# Patient Record
Sex: Male | Born: 1950 | Race: Black or African American | Hispanic: No | Marital: Married | Smoking: Never smoker
Health system: Southern US, Community
[De-identification: ages and names within clinical notes are randomized; demographics above are authoritative.]

## PROBLEM LIST (undated history)

## (undated) DIAGNOSIS — E119 Type 2 diabetes mellitus without complications: Secondary | ICD-10-CM

## (undated) DIAGNOSIS — E785 Hyperlipidemia, unspecified: Secondary | ICD-10-CM

## (undated) DIAGNOSIS — F419 Anxiety disorder, unspecified: Secondary | ICD-10-CM

## (undated) DIAGNOSIS — M1990 Unspecified osteoarthritis, unspecified site: Secondary | ICD-10-CM

## (undated) DIAGNOSIS — I10 Essential (primary) hypertension: Secondary | ICD-10-CM

## (undated) DIAGNOSIS — Z125 Encounter for screening for malignant neoplasm of prostate: Secondary | ICD-10-CM

## (undated) DIAGNOSIS — R42 Dizziness and giddiness: Secondary | ICD-10-CM

## (undated) DIAGNOSIS — Z79899 Other long term (current) drug therapy: Secondary | ICD-10-CM

## (undated) HISTORY — DX: Dizziness and giddiness: R42

## (undated) HISTORY — DX: Encounter for screening for malignant neoplasm of prostate: Z12.5

## (undated) HISTORY — DX: Other long term (current) drug therapy: Z79.899

## (undated) HISTORY — DX: Type 2 diabetes mellitus without complications: E11.9

## (undated) HISTORY — PX: KNEE SURGERY: SHX244

## (undated) HISTORY — DX: Unspecified osteoarthritis, unspecified site: M19.90

## (undated) HISTORY — PX: CARPAL TUNNEL RELEASE: SHX101

## (undated) HISTORY — PX: UMBILICAL HERNIA REPAIR: SHX196

## (undated) HISTORY — DX: Anxiety disorder, unspecified: F41.9

## (undated) HISTORY — DX: Essential (primary) hypertension: I10

## (undated) HISTORY — PX: ANKLE SURGERY: SHX546

## (undated) HISTORY — DX: Hyperlipidemia, unspecified: E78.5

## (undated) SURGERY — Surgical Case
Anesthesia: *Unknown

---

## 2010-09-21 ENCOUNTER — Emergency Department (HOSPITAL_COMMUNITY)
Admission: EM | Admit: 2010-09-21 | Discharge: 2010-09-21 | Disposition: A | Discharge status: Home / Self Care | Payer: Medicare Other | Attending: Emergency Medicine | Admitting: Emergency Medicine

## 2010-09-21 DIAGNOSIS — I10 Essential (primary) hypertension: Secondary | ICD-10-CM | POA: Insufficient documentation

## 2010-09-26 ENCOUNTER — Emergency Department (HOSPITAL_COMMUNITY): Payer: Medicare Other

## 2010-09-26 ENCOUNTER — Inpatient Hospital Stay (HOSPITAL_COMMUNITY)
Admission: EM | Admit: 2010-09-26 | Discharge: 2010-09-28 | DRG: 304 | Disposition: A | Discharge status: Home / Self Care | Payer: Medicare Other | Attending: Internal Medicine | Admitting: Internal Medicine

## 2010-09-26 ENCOUNTER — Inpatient Hospital Stay (HOSPITAL_COMMUNITY): Payer: Medicare Other

## 2010-09-26 DIAGNOSIS — E860 Dehydration: Secondary | ICD-10-CM | POA: Diagnosis present

## 2010-09-26 DIAGNOSIS — D72829 Elevated white blood cell count, unspecified: Secondary | ICD-10-CM | POA: Diagnosis present

## 2010-09-26 DIAGNOSIS — E876 Hypokalemia: Secondary | ICD-10-CM | POA: Diagnosis present

## 2010-09-26 DIAGNOSIS — I10 Essential (primary) hypertension: Principal | ICD-10-CM | POA: Diagnosis present

## 2010-09-26 DIAGNOSIS — Z794 Long term (current) use of insulin: Secondary | ICD-10-CM

## 2010-09-26 DIAGNOSIS — E1310 Other specified diabetes mellitus with ketoacidosis without coma: Secondary | ICD-10-CM | POA: Diagnosis present

## 2010-09-26 DIAGNOSIS — E785 Hyperlipidemia, unspecified: Secondary | ICD-10-CM | POA: Diagnosis present

## 2010-09-26 DIAGNOSIS — Z7982 Long term (current) use of aspirin: Secondary | ICD-10-CM

## 2010-09-26 LAB — DIFFERENTIAL
Basophils Absolute: 0 10*3/uL (ref 0.0–0.1)
Basophils Relative: 0 % (ref 0–1)
Monocytes Absolute: 0.5 10*3/uL (ref 0.1–1.0)
Neutro Abs: 6.3 10*3/uL (ref 1.7–7.7)

## 2010-09-26 LAB — URINALYSIS, ROUTINE W REFLEX MICROSCOPIC
Leukocytes, UA: NEGATIVE
Nitrite: NEGATIVE
Specific Gravity, Urine: 1.03 (ref 1.005–1.030)
Urobilinogen, UA: 0.2 mg/dL (ref 0.0–1.0)

## 2010-09-26 LAB — GLUCOSE, CAPILLARY
Glucose-Capillary: >600 mg/dL (ref 70–99)
Glucose-Capillary: 406 mg/dL — ABNORMAL HIGH (ref 70–99)
Glucose-Capillary: 297 mg/dL — ABNORMAL HIGH (ref 70–99)

## 2010-09-26 LAB — POCT I-STAT 3, VENOUS BLOOD GAS (G3P V)
O2 Saturation: 35 %
TCO2: 29 mmol/L (ref 0–100)

## 2010-09-26 LAB — CBC
Hemoglobin: 16.8 g/dL (ref 13.0–17.0)
MCHC: 35.1 g/dL (ref 30.0–36.0)
RDW: 12.3 % (ref 11.5–15.5)

## 2010-09-26 LAB — BASIC METABOLIC PANEL
CO2: 16 mEq/L — ABNORMAL LOW (ref 19–32)
Calcium: 10 mg/dL (ref 8.4–10.5)
Calcium: 9.5 mg/dL (ref 8.4–10.5)
Creatinine, Ser: 1.38 mg/dL (ref 0.4–1.5)
GFR calc Af Amer: 48 mL/min — ABNORMAL LOW (ref >60)
GFR calc Af Amer: >60 mL/min (ref >60)
GFR calc non Af Amer: 40 mL/min — ABNORMAL LOW (ref >60)
GFR calc non Af Amer: 53 mL/min — ABNORMAL LOW (ref >60)
Glucose, Bld: 273 mg/dL — ABNORMAL HIGH (ref 70–99)
Sodium: 122 mEq/L — ABNORMAL LOW (ref 135–145)
Sodium: 131 mEq/L — ABNORMAL LOW (ref 135–145)

## 2010-09-26 LAB — URINE MICROSCOPIC-ADD ON

## 2010-09-27 LAB — CBC
HCT: 41.5 % (ref 39.0–52.0)
MCH: 31.5 pg (ref 26.0–34.0)
MCHC: 35.2 g/dL (ref 30.0–36.0)
MCV: 89.4 fL (ref 78.0–100.0)
Platelets: 230 10*3/uL (ref 150–400)
RDW: 12.2 % (ref 11.5–15.5)
WBC: 11.3 10*3/uL — ABNORMAL HIGH (ref 4.0–10.5)

## 2010-09-27 LAB — GLUCOSE, CAPILLARY
Glucose-Capillary: 197 mg/dL — ABNORMAL HIGH (ref 70–99)
Glucose-Capillary: 165 mg/dL — ABNORMAL HIGH (ref 70–99)
Glucose-Capillary: 152 mg/dL — ABNORMAL HIGH (ref 70–99)
Glucose-Capillary: 143 mg/dL — ABNORMAL HIGH (ref 70–99)
Glucose-Capillary: 148 mg/dL — ABNORMAL HIGH (ref 70–99)
Glucose-Capillary: 202 mg/dL — ABNORMAL HIGH (ref 70–99)
Glucose-Capillary: 469 mg/dL — ABNORMAL HIGH (ref 70–99)
Glucose-Capillary: 389 mg/dL — ABNORMAL HIGH (ref 70–99)

## 2010-09-27 LAB — HEMOGLOBIN A1C
Hgb A1c MFr Bld: 11.4 % — ABNORMAL HIGH (ref <5.7)
Mean Plasma Glucose: 280 mg/dL — ABNORMAL HIGH (ref <117)

## 2010-09-27 LAB — COMPREHENSIVE METABOLIC PANEL
ALT: 22 U/L (ref 0–53)
Calcium: 9.5 mg/dL (ref 8.4–10.5)
Creatinine, Ser: 1.26 mg/dL (ref 0.4–1.5)
GFR calc Af Amer: >60 mL/min (ref >60)
GFR calc non Af Amer: 59 mL/min — ABNORMAL LOW (ref >60)
Glucose, Bld: 260 mg/dL — ABNORMAL HIGH (ref 70–99)
Sodium: 133 mEq/L — ABNORMAL LOW (ref 135–145)
Total Protein: 8.8 g/dL — ABNORMAL HIGH (ref 6.0–8.3)

## 2010-09-27 LAB — LIPID PANEL
Cholesterol: 248 mg/dL — ABNORMAL HIGH (ref 0–200)
HDL: 49 mg/dL (ref >39)
LDL Cholesterol: 170 mg/dL — ABNORMAL HIGH (ref 0–99)
Triglycerides: 147 mg/dL (ref <150)

## 2010-09-27 LAB — BASIC METABOLIC PANEL
CO2: 23 mEq/L (ref 19–32)
Chloride: 100 mEq/L (ref 96–112)
GFR calc Af Amer: >60 mL/min (ref >60)
Glucose, Bld: 206 mg/dL — ABNORMAL HIGH (ref 70–99)
Sodium: 135 mEq/L (ref 135–145)

## 2010-09-27 LAB — MAGNESIUM: Magnesium: 2.6 mg/dL — ABNORMAL HIGH (ref 1.5–2.5)

## 2010-09-28 LAB — GLUCOSE, CAPILLARY: Glucose-Capillary: 305 mg/dL — ABNORMAL HIGH (ref 70–99)

## 2010-09-28 LAB — BASIC METABOLIC PANEL
CO2: 24 mEq/L (ref 19–32)
Calcium: 8.7 mg/dL (ref 8.4–10.5)
Creatinine, Ser: 1.08 mg/dL (ref 0.4–1.5)
GFR calc Af Amer: >60 mL/min (ref >60)

## 2010-09-28 LAB — CBC
Hemoglobin: 13 g/dL (ref 13.0–17.0)
MCH: 30.8 pg (ref 26.0–34.0)
MCHC: 33.7 g/dL (ref 30.0–36.0)
Platelets: 193 10*3/uL (ref 150–400)
RDW: 12.4 % (ref 11.5–15.5)

## 2010-10-17 ENCOUNTER — Ambulatory Visit: Payer: Medicare Other | Admitting: Dietician

## 2010-10-30 ENCOUNTER — Encounter: Payer: Medicare Other | Attending: Urology | Admitting: Dietician

## 2010-10-30 DIAGNOSIS — E119 Type 2 diabetes mellitus without complications: Secondary | ICD-10-CM | POA: Insufficient documentation

## 2010-10-30 DIAGNOSIS — Z713 Dietary counseling and surveillance: Secondary | ICD-10-CM | POA: Insufficient documentation

## 2010-11-20 NOTE — Discharge Summary (Signed)
NAME:  Clarence Brooks, Clarence Brooks NO.:  [id number]  MEDICAL RECORD NO.:  [id number]           PATIENT TYPE:  I  LOCATION:  5504                         FACILITY:  [facility]  PHYSICIAN:  Ladell Pier, M.D.   DATE OF BIRTH:  24-Dec-1950  DATE OF ADMISSION:  09/26/2010 DATE OF DISCHARGE:  09/28/2010                              DISCHARGE SUMMARY   DISCHARGE DIAGNOSES: 1. Headache. 2. Hypertension. 3. New-onset diabetes. 4. Dyslipidemia. 5. Dehydration. 6. Diabetic ketoacidosis. 7. New-onset type 2 diabetes.  DISCHARGE MEDICATIONS: 1. Amlodipine 5 mg daily. 2. Aspirin 81 mg daily. 3. Sliding scale insulin. 4. Lantus 15 units at bedtime. 5. Losartan 25 mg daily. 6. Crestor 10 mg at bedtime. 7. Tylenol 1 tablet daily as needed. The patient is also discharged with insulin syringes and a prescription for a diabetic starter kit.  PROCEDURES: 1. CT of the head, old right basal ganglia lacunar infarct.  No acute     intracranial abnormality. 2. Chest x-ray low lung volumes with vascular crowding and     atelectasis.  CONSULTANTS:  None.  HISTORY OF PRESENT ILLNESS:  The patient is a 60 year old African American male with past medical history significant for hypertension. The patient stated that for the past 4-5 days he has been having left- sided headache and some nausea and increased urinary frequency but no chest pain, no shortness of breath.  He presented to the doctor's office and was brought via EMS to the emergency room.  At the doctor's office, CBG was 450.  In the emergency room, CBGs and his blood sugars were over 900.  PAST MEDICAL HISTORY:  Per admission H and P.  FAMILY HISTORY:  Per admission H and P.  SOCIAL HISTORY:  Per admission H and P.  MEDICATIONS:  Per admission H and P.  ALLERGIES:  Per admission H and P.  REVIEW OF SYSTEMS:  Per admission H and P.  PHYSICAL EXAMINATION AT THE TIME OF DISCHARGE:  VITAL SIGNS: Temperature 97.6, pulse of  66, respirations 18, blood pressure 131/79, pulse ox 96% on room air. GENERAL:  The patient is sitting up in chair, well-nourished African American male. HEENT:  Head:  Normocephalic, atraumatic.  Pupils reactive to light. Throat without erythema. CARDIOVASCULAR:  Regular rate and rhythm. LUNGS:  Clear bilaterally. ABDOMEN:  Positive bowel sounds. EXTREMITIES:  Without any edema.  HOSPITAL COURSE: 1. Headache:  The patient initially had some headache, blood pressure     was elevated, most likely hypertensive urgency.  Once the blood     pressure improved, the headache slowly started to resolve.  He did     have a head CT that only showed an old lacunar infarct, no acute     stroke. 2. DKA:  The patient is noted to be in DKA with a gap of 22, this is     new onset.  We will start him on Lantus and sliding scale insulin.     We will go ahead and discharge him on Lantus sliding scale insulin,     and he will follow up as an outpatient with [facility]  Para March,     the nurse practitioner, who he has seen in the past. 3. Dyslipidemia.  The patient will be discharged on Crestor once daily     and that can be followed up as an outpatient.  DISCHARGE LABORATORY DATA:  Sodium 135, potassium 4.0, chloride 105, CO2 is 24, glucose 234, BUN 21, creatinine 1.08.  WBC 7.4, hemoglobin 13, MCV 91.5, platelets 193.  Hemoglobin A1c 11.4.  TSH 0.822.  Total cholesterol 248, triglycerides 147, HDL 49, LDL of 170, creatine kinase of 117.  Time spent with the patient and doing this discharge is approximately 40 minutes.     Ladell Pier, M.D.     NJ/MEDQ  D:  09/28/2010  T:  09/28/2010  Job:  [id number]  Electronically Signed by Virginia Rochester M.D. on 11/20/2010 03:54:32 PM

## 2010-11-20 NOTE — H&P (Signed)
NAME:  BEATRIZ, SETTLES NO.:  [id number]  MEDICAL RECORD NO.:  [id number]           PATIENT TYPE:  E  LOCATION:  [facility]                         FACILITY:  [facility]  PHYSICIAN:  Ladell Pier, M.D.   DATE OF BIRTH:  [birth year]  DATE OF ADMISSION:  09/26/2010 DATE OF DISCHARGE:                             HISTORY & PHYSICAL   CHIEF COMPLAINT:  Headache.  HISTORY OF PRESENT ILLNESS:  The patient is a 60 year old African- American male with past medical history significant for hypertension. The patient stated that for the past 4 to 5 days has been having left- sided headache and some nausea and increased urinary frequency, but no chest pain, no shortness of breath.  He presented to the doctor's office and was brought via EMS to the emergency room.  At the doctor's office, CBG was over 450.  The patient had no history of diabetes.  PAST MEDICAL HISTORY:  Significant for, 1. Hypertension. 2. Hernia repair x2. 3. Carpal tunnel surgery x2. 4. Bilateral knee repair surgery.  FAMILY HISTORY:  Mother has blood pressure.  Father died of throat cancer.  SOCIAL HISTORY:  He does not smoke.  He does not drink alcohol.  He is married.  He has three children, two grown and one 29 years old.  He is presently on disability secondary to his multiple orthopedic surgeries.  MEDICATIONS:  He takes triamterene HCTZ daily, but per the patient he has not been taken it.  ALLERGIES:  TO LISINOPRIL.  REVIEW OF SYSTEMS:  Negative, otherwise stated in HPI.  PHYSICAL EXAM:  VITAL SIGNS:  Temperature 37.5, blood pressure 142/100, pulse 105, respirations 18, pulse ox 97% on room air. GENERAL:  The patient is sitting up on stretcher, well-nourished, African-American male. HEENT:  Normocephalic, atraumatic.  Pupils reactive to light.  Throat without erythema. CARDIOVASCULAR:  Tachycardic. LUNGS:  Clear bilaterally. ABDOMEN:  Positive bowel sounds. EXTREMITIES:  Without edema. NEURO:   Nonfocal.  Cranial nerves II through XII intact.  Strength 5/5 throughout.  Finger-to-nose intact.  LABORATORY DATA:  Labs, pH 7.30, PCO2 of 54, PO2 of 23, bicarb 27.5 this is a venous sample.  3 to 6 wbc's, 0-2 rbc, greater than 100,000 glucose in the urine.  Sodium 122, potassium 5.2, chloride 84, CO2 of 16, glucose of 959, BUN 35, creatinine 1.76, calcium 10.  WBC 9.5, hemoglobin 16.8, MCV 89.9, platelets of 241.  ASSESSMENT: 1. Diabetic ketoacidosis. 2. Hypertension. 3. Dehydration. 4. Headache.  PLAN:  We will admit the patient to the hospital, put him on DKA protocol, start him on Norvasc.  He is allergic to ACE inhibitors. Also, we will Lopressor p.r.n. for hypertension.  Check fasting lipid, hemoglobin A1c.  With his headache, we will get a head CT.  He will get a chest x-ray as well, get EKG, check creatinine kinase levels.  He has no chest pain, so we will not cycle cardiac enzymes.  We will also check his liver function tests and he already has a urinalysis.  We will also get EKG.  Time spent with the patient and doing this admission is approximately 45 minutes.  Ladell Pier, M.D.     NJ/MEDQ  D:  09/26/2010  T:  09/26/2010  Job:  [id number]  Electronically Signed by Virginia Rochester M.D. on 11/20/2010 03:54:29 PM

## 2012-04-20 IMAGING — CT CT HEAD W/O CM
1 of 2 series · 13 of 30 positions shown, 17 images · non-contrast
Comparison: None

CLINICAL DATA: Hyperglycemia.  Left sided headache.

CT HEAD WITHOUT CONTRAST
TECHNIQUE: Contiguous axial images were obtained from the base of
the skull through the vertex without contrast.

[Series 2: brain · axial · 0.49mm/px · z∈[+147,+279]mm · 13 of 32 slices shown, 17 images]
[im 3/32  brain]
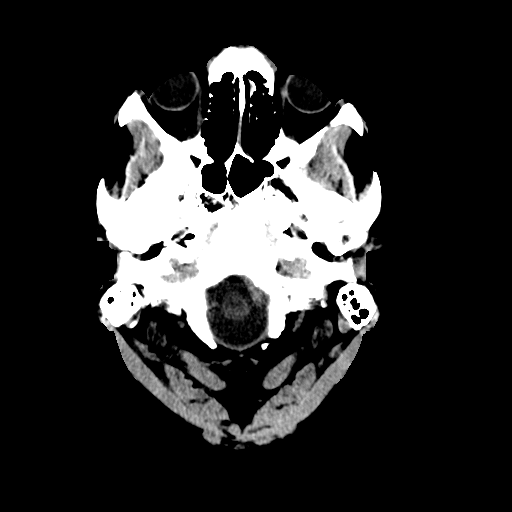
[im 3/32  bone]
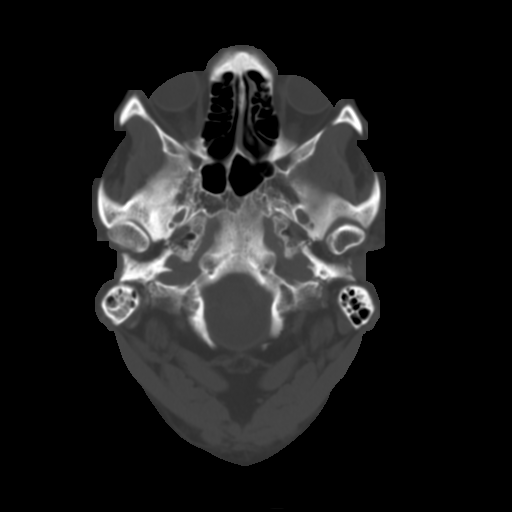
[im 5/32  brain]
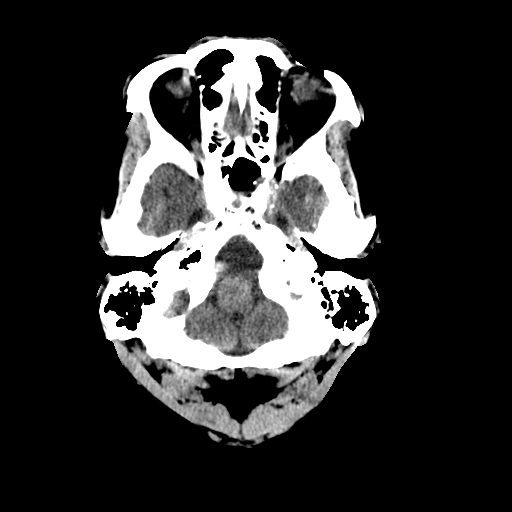
[im 7/32  brain]
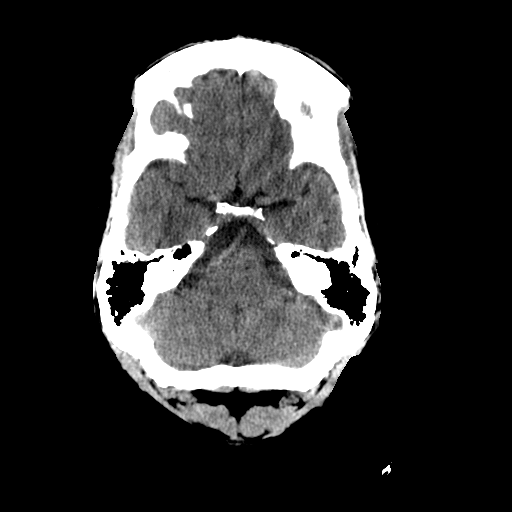
[im 9/32  brain]
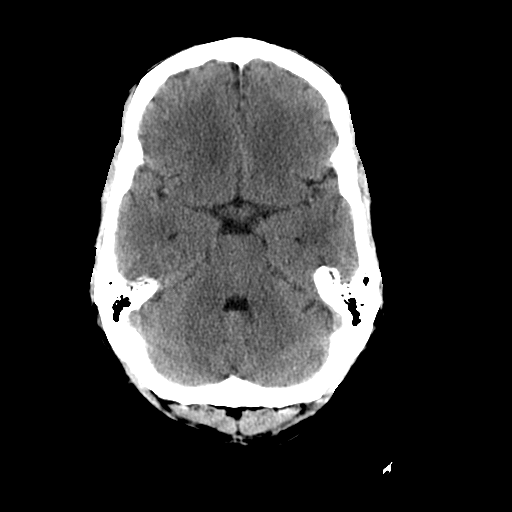
[im 12/32  brain]
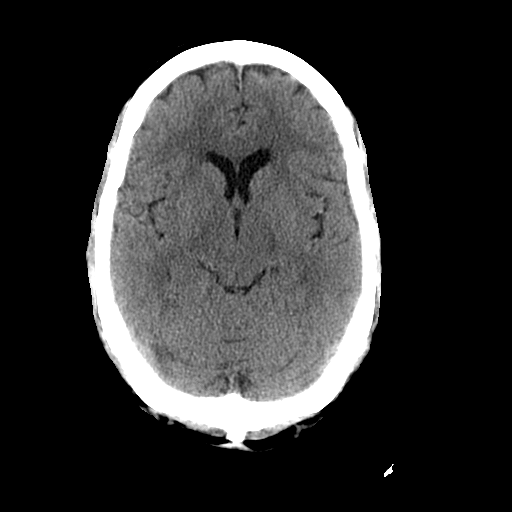
[im 12/32  bone]
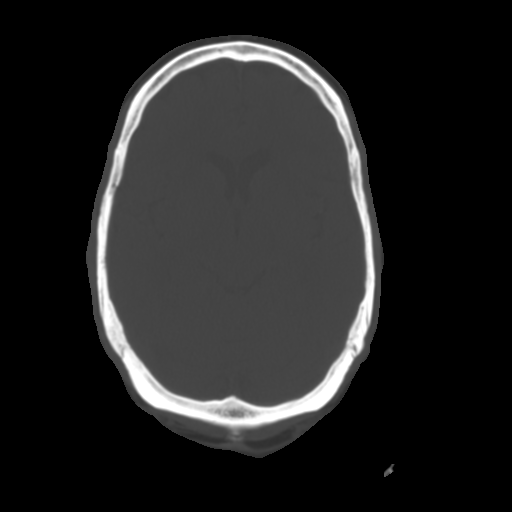
[im 14/32  brain]
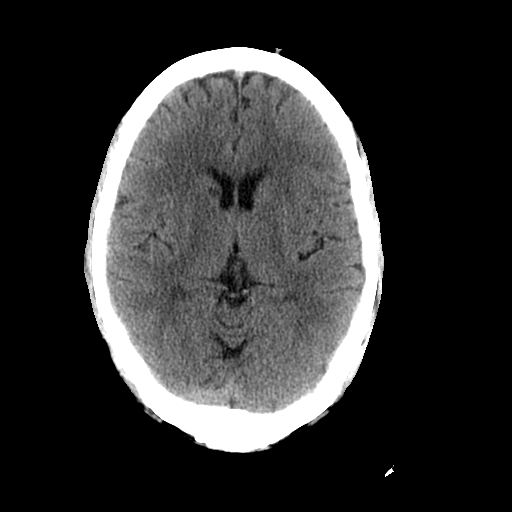
[im 16/32  brain]
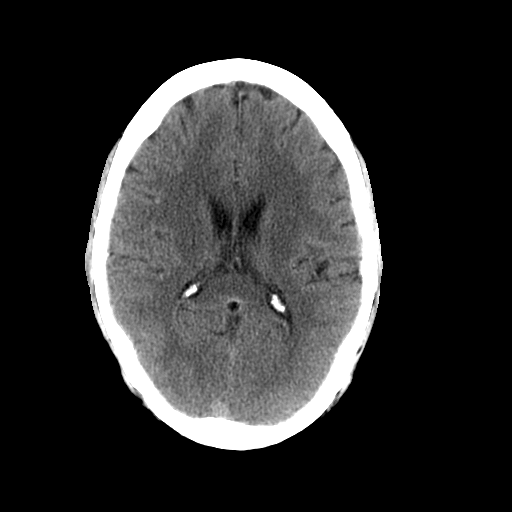
[im 18/32  brain]
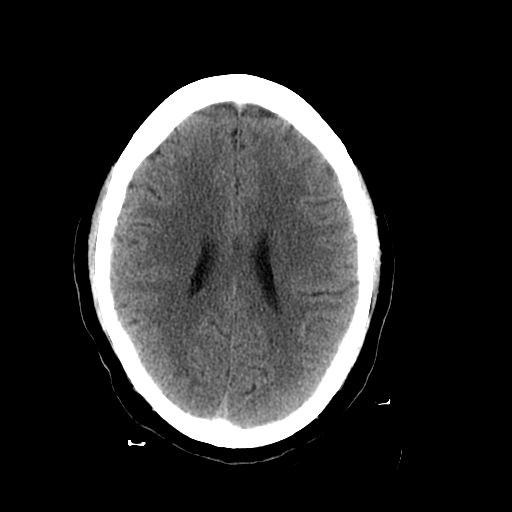
[im 20/32  brain]
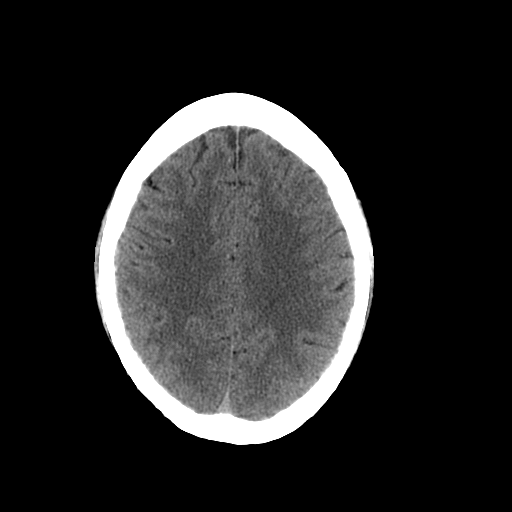
[im 20/32  bone]
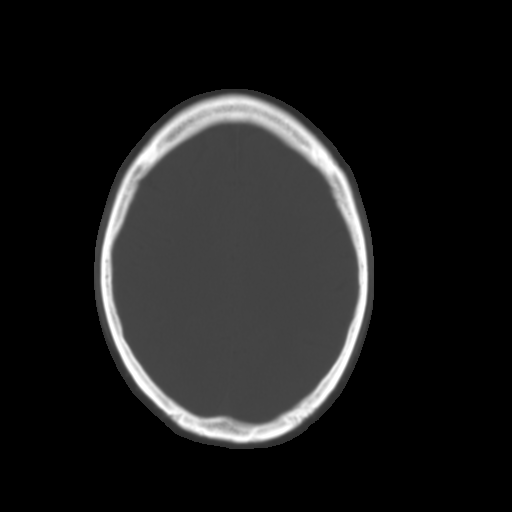
[im 23/32  brain]
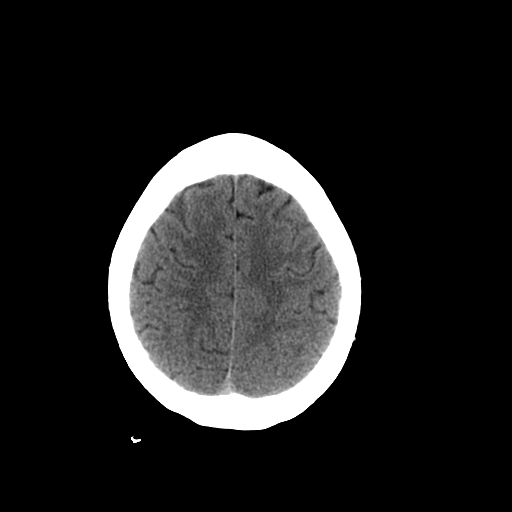
[im 25/32  brain]
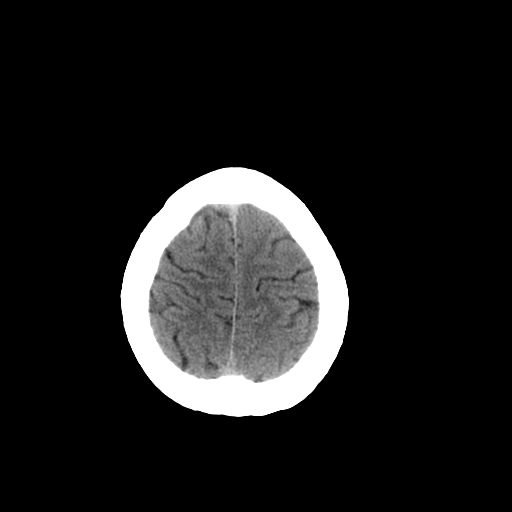
[im 27/32  brain]
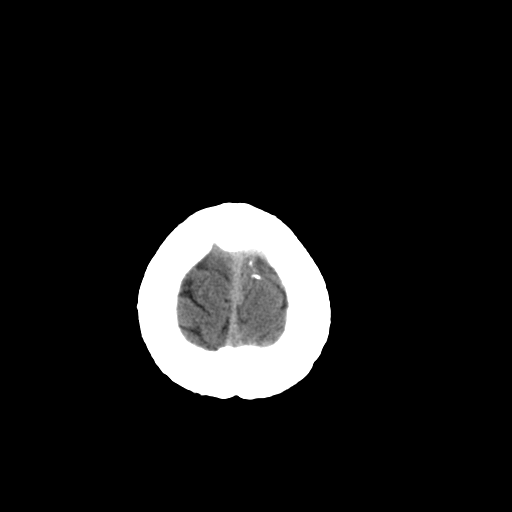
[im 29/32  brain]
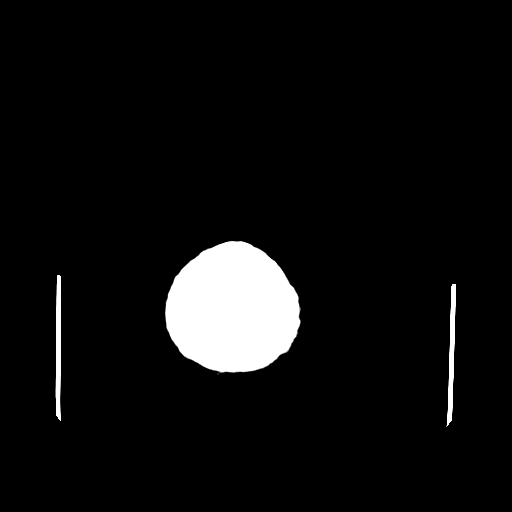
[im 29/32  bone]
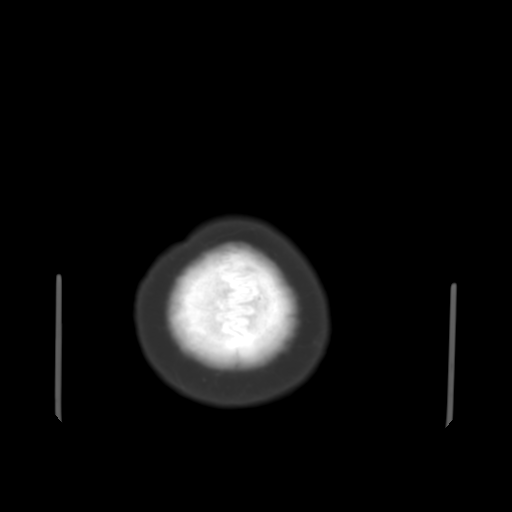

[13 of 30 positions shown; findings below may reference images not displayed]

FINDINGS: Old lacunar infarcts are seen in the right basal ganglia.
No acute intracranial abnormality.  Specifically, no hemorrhage,
hydrocephalus, mass lesion, acute infarction, or significant
intracranial injury.  No acute calvarial abnormality. Visualized
paranasal sinuses and mastoids clear.  Orbital soft tissues
unremarkable.
IMPRESSION: Old right basal ganglia lacunar infarcts. No acute intracranial
abnormality.

## 2013-02-09 ENCOUNTER — Encounter: Payer: Medicare Other | Admitting: *Deleted

## 2013-02-14 ENCOUNTER — Ambulatory Visit: Payer: Medicare Other | Admitting: Dietician

## 2013-03-09 ENCOUNTER — Ambulatory Visit (INDEPENDENT_AMBULATORY_CARE_PROVIDER_SITE_OTHER): Payer: Medicare Other | Admitting: Cardiology

## 2013-03-09 ENCOUNTER — Ambulatory Visit (INDEPENDENT_AMBULATORY_CARE_PROVIDER_SITE_OTHER): Payer: Medicare Other | Admitting: Endocrinology

## 2013-03-09 ENCOUNTER — Encounter: Payer: Self-pay | Admitting: Cardiology

## 2013-03-09 ENCOUNTER — Encounter: Payer: Self-pay | Admitting: Endocrinology

## 2013-03-09 VITALS — BP 157/84 | HR 105 | Ht 73.5 in | Wt 232.0 lb

## 2013-03-09 VITALS — BP 132/78 | HR 90 | Temp 98.2°F | Resp 12 | Ht 73.5 in | Wt 232.8 lb

## 2013-03-09 DIAGNOSIS — I10 Essential (primary) hypertension: Secondary | ICD-10-CM | POA: Insufficient documentation

## 2013-03-09 DIAGNOSIS — E1149 Type 2 diabetes mellitus with other diabetic neurological complication: Secondary | ICD-10-CM

## 2013-03-09 DIAGNOSIS — R9431 Abnormal electrocardiogram [ECG] [EKG]: Secondary | ICD-10-CM

## 2013-03-09 NOTE — Patient Instructions (Addendum)
Please check blood sugars at least half the time about 2 hours after any meal and as directed on waking up.   Please bring blood sugar monitor to each visit  

## 2013-03-09 NOTE — Progress Notes (Signed)
HPI Patient presents for evaluation of abnormal EKG and multiple cardiovascular risk factors. He has not had a history of coronary disease. However, he has had difficult to control hypertension, dyslipidemia and diabetes. He has been slightly difficult to control and has self medicated to some degree as he thought he was having side effects to some of his medications. He does not describe cardiovascular symptoms such as chest pressure, neck or arm discomfort. He doesn't report any palpitations, presyncope or syncope. However, he describes "spins". These apparently are dizzy episodes. These are sporadic. He does not describe orthostatic symptoms. He is not described shortness of breath, PND or orthopnea. He is somewhat limited by joint pains but can do walking  For exercise. He was referred for evaluation given his multiple risk factors. In addition he does have an abnormal EKG with T wave inversion as described below.  Allergies  Allergen Reactions  . Ace Inhibitors Swelling  . Lisinopril     Throat swelling     Current Outpatient Prescriptions  Medication Sig Dispense Refill  . AMLODIPINE BESYLATE PO Take 10 mg by mouth daily.      . hydrochlorothiazide (HYDRODIURIL) 25 MG tablet Take 25 mg by mouth daily.      . insulin aspart (NOVOLOG) 100 UNIT/ML injection Inject 15 Units into the skin 3 (three) times daily with meals.      . Insulin Glargine (LANTUS OPTICLIK) 100 UNIT/ML SOCT Inject 28 Units into the skin daily.      . Multiple Vitamins-Minerals (EQ COMPLETE MULTIVIT ADULT 50+ PO) Take by mouth.       No current facility-administered medications for this visit.    Past Medical History  Diagnosis Date  . Diabetes mellitus without complication   . Hypertension   . Dyslipidemia   . Dizziness   . Anxiety   . Osteoarthritis   . Special screening for malignant neoplasm of prostate   . Encounter for long-term (current) use of other medications     No past surgical history on  file.  Family History  Problem Relation Age of Onset  . Hypertension Mother   . Cancer Father   . Hypertension Brother     History   Social History  . Marital Status: Married    Spouse Name: N/A    Number of Children: N/A  . Years of Education: N/A   Occupational History  . Not on file.   Social History Main Topics  . Smoking status: Never Smoker   . Smokeless tobacco: Not on file  . Alcohol Use: Not on file  . Drug Use: Not on file  . Sexual Activity: Not on file   Other Topics Concern  . Not on file   Social History Narrative  . No narrative on file    ROS:  Positive for dizziness, tinnitus, joint pains. Otherwise as stated in the HPI and negative for all other systems.  PHYSICAL EXAM BP 157/84  Pulse 105  Ht 6' 1.5" (1.867 m)  Wt 232 lb (105.235 kg)  BMI 30.19 kg/m2 GENERAL:  Well appearing HEENT:  Pupils equal round and reactive, fundi not visualized, oral mucosa unremarkable, poor dentition NECK:  No jugular venous distention, waveform within normal limits, carotid upstroke brisk and symmetric, no bruits, no thyromegaly LYMPHATICS:  No cervical, inguinal adenopathy LUNGS:  Clear to auscultation bilaterally BACK:  No CVA tenderness CHEST:  Unremarkable HEART:  PMI not displaced or sustained,S1 and S2 within normal limits, no S3, no S4, no clicks,  no rubs, no murmurs ABD:  Flat, positive bowel sounds normal in frequency in pitch, no bruits, no rebound, no guarding, no midline pulsatile mass, no hepatomegaly, no splenomegaly EXT:  2 plus pulses throughout, no edema, no cyanosis no clubbing SKIN:  No rashes no nodules NEURO:  Cranial nerves II through XII grossly intact, motor grossly intact throughout PSYCH:  Cognitively intact, oriented to person place and time   EKG:  Sinus rhythm, rate 105, axis within normal limits, intervals within normal limits, left ventricular hypertrophy by voltage criteria with lateral T-wave inversions consistent with ischemia.  03/09/2013  ASSESSMENT AND PLAN  ABNORMAL EKG:  The patient does have significant cardiovascular risk factors. He also has an abnormal EKG which may be related to his hypertension. However, he is at moderate risk for obstructive coronary disease. Stress testing is indicated. I am going to try walking on a treadmill with echo imaging to rule out ischemia and also to evaluate his LV size and function. Ultimately he does need aggressive risk reduction and we discussed this. He has been somewhat hesitant in the past because of real or perceived side effects to his medication.  DIZZINESS:  The etiology of this is not clear. He does not have orthostatic symptoms. I do not strongly suspect a dysrhythmia. I will defer further management to FULP, CAMMIE, MD  HTN:  His blood pressure remains above target. However, he has been sensitive to medications. At this point I will not change his regimen. I think he is going to need slow and gradual adjustments to try to avoid side effects. He also needs therapeutic lifestyle changes to include weight loss.

## 2013-03-09 NOTE — Patient Instructions (Addendum)
NO CHANGES WERE MADE TODAY  Your physician has requested that you have an stress echocardiogram. Echocardiography is a painless test that uses sound waves to create images of your heart. It provides your doctor with information about the size and shape of your heart and how well your heart's chambers and valves are working. This procedure takes approximately one hour. There are no restrictions for this procedure.  FOLLOW UP AS NEEDED

## 2013-03-09 NOTE — Progress Notes (Signed)
Patient ID: Clarence Brooks, male   DOB: [birth year], 62 y.o.   MRN: [id number]    Reason for Appointment : Consultation for Type 2 Diabetes  History of Present Illness          Diagnosis: Type 2 diabetes mellitus, date of diagnosis: 09/2010      Past history:  He was diagnosed to have diabetes when he had severe hyperglycemia with symptoms in 09/2010. At that time he was admitted to the hospital and apparently was lethargic but not having any vomiting. Glucose was 959. His initial evaluation had shown a low CO2 of 16 but no ketonuria. A1c was 11.1  Recent history: The patient was referred several weeks ago for improving his control but did not schedule an appointment until recently. He was taking a fixed dose of Lantus previously and was taking NovoLog based on how high his sugar was before meals rather than any fixed coverage. He was recently seen by his PCP and because of overall high readings his insulin dose was increased and he was referred here for further management. He was also told to take a fixed dose of 15 units with each meal of NovoLog along with his Lantus. However his A1c and other records are not available    Glucose monitoring:  done one time a day         Glucometer:   [employer] brand Blood Glucose readings from recall: readings before breakfast: About 140, checking sporadically and not keeping records         Hypoglycemia frequency: Never.           Self-care: The diet that the patient has been following [id number] to limit carbohydrates.      Meals: 3 meals per day.  he is eating oatmeal, cooking bacon and toast at breakfast, usually a chicken and vegetable at lunch and dinner, has snacks with fruit   He is usually eating low-fat meal except sometimes getting pizza      Physical activity: exercise: Trying to be walking or doing a sporting activity daily for about 30 minutes           Dietician visit: Most recent: 2014.                Oral hypoglycemic drugs the patient is taking are:  None, has not been on any oral hypoglycemic drugs before        Side effects from medications have been: None INSULIN regimen is described as:  Lantus 28 units once a day, NovoLog 15 units 3 times a day Compliance with the medical regimen: good Retinal exam: Most recent: 2 yeasr.     Filed Weights   03/09/13 1443  Weight: 232 lb 12.8 oz (105.597 kg)      Medication List       This list is accurate as of: 03/09/13  3:11 PM.  Always use your most recent med list.               AMLODIPINE BESYLATE PO  Take 10 mg by mouth daily.     EQ COMPLETE MULTIVIT ADULT 50+ PO  Take by mouth.     hydrochlorothiazide 25 MG tablet  Commonly known as:  HYDRODIURIL  Take 25 mg by mouth daily.     insulin aspart 100 UNIT/ML injection  Commonly known as:  novoLOG  Inject 15 Units into the skin 3 (three) times daily with meals.     LANTUS OPTICLIK 100 UNIT/ML Soct  Generic drug:  Insulin Glargine  Inject 28 Units into the skin daily.        Allergies:  Allergies  Allergen Reactions  . Ace Inhibitors Swelling  . Lisinopril     Throat swelling     Past Medical History  Diagnosis Date  . Diabetes mellitus without complication   . Hypertension   . Dyslipidemia   . Dizziness   . Anxiety   . Osteoarthritis   . Special screening for malignant neoplasm of prostate   . Encounter for long-term (current) use of other medications     History reviewed. No pertinent past surgical history.  Family History  Problem Relation Age of Onset  . Hypertension Mother   . Cancer Father   . Hypertension Brother     Social History:  reports that he has never smoked. He does not have any smokeless tobacco history on file. His alcohol and drug histories are not on file.    Review of Systems      His weight has been stable recently, previously range 215-240      Lipids: Not on medications and has unknown levels of lipids                Skin: No rash or infections     Thyroid:  No   unusual fatigue.     The blood pressure has been treated with amlodipine and HCTZ     No swelling of feet.     No shortness of breath on exertion.     Bowel habits:  No change.                    Heartburn/pain: no.       No frequency of urination or nocturia       No joint  pains.         No history of Numbness, tingling or burning in her feet     Physical Examination:  BP 132/78  Pulse 90  Temp(Src) 98.2 F (36.8 C)  Resp 12  Ht 6' 1.5" (1.867 m)  Wt 232 lb 12.8 oz (105.597 kg)  BMI 30.29 kg/m2  SpO2 95%  GENERAL:         Patient appears to have generalized obesity.   HEENT:         Eye exam shows normal external appearance. Fundus exam shows no retinopathy. Oral exam shows normal mucosa .  NECK:         General:  Neck exam shows no lymphadenopathy. Carotids are normal to palpation and no bruit heard. Thyroid is not enlarged and no nodules felt.   LUNGS:         Chest is symmetrical. Lungs are clear to auscultation.Marland Kitchen   HEART:         Heart sounds:  S1 and S2 are normal. No murmurs or clicks heard., no S3 or S4.   ABDOMEN:         General:  There is no distention present. Liver and spleen are not palpable. No other mass or tenderness present.  EXTREMITIES:     There is no edema. No skin lesions present.Marland Kitchen  NEUROLOGICAL:        Vibration sense is moderately  reduced in toes. Ankle jerks are absent bilaterally.         Diabetic foot exam:            Inspection  Normal  Monofilament  Normal  MUSCULOSKELETAL:       There is no enlargement or deformity of the joints. Spine is normal to inspection.Marland Kitchen   PEDAL pulses: Normal SKIN:       No rash or lesions of concern.        ASSESSMENT:  Diabetes type 2, uncontrolled - 250.02    He has had diabetes which initially presented with hyperosmolar state but no clear evidence of ketoacidosis and no ketonuria Subsequently has been on insulin with a basal bolus regimen Currently no records are available to evaluate  recent level of control He is also not monitoring his blood sugars except sporadically in the mornings Not clear if he is insulin deficient at this time but he is still requiring about 75 units a day for control His BMI is mildly increased at 30, indicating some insulin resistance Currently he is doing well with exercise regimen. His dietary history indicates he is generally having balanced meals which is not high fat and with minimal restaurant meals He has also been given diabetes education  Complications: Some objective signs of neuropathy  PLAN:   Will need to evaluate his blood sugar patterns and A1c to determine adequacy of control and need for any adjustments If his blood sugars appear to be fairly consistent and well controlled may consider adding pioglitazone and metformin for insulin resistance which may enable him to wean off at least his mealtime insulin He will start using the One Touch monitor and bring this for download on the next visit. Discussed when to check his blood sugars as well as blood sugar targets, given blood sugar diary with sample times of testing  [facility] 03/09/2013, 3:11 PM

## 2013-03-14 ENCOUNTER — Telehealth: Payer: Self-pay | Admitting: Cardiology

## 2013-03-14 ENCOUNTER — Other Ambulatory Visit: Payer: Self-pay | Admitting: *Deleted

## 2013-03-15 ENCOUNTER — Telehealth: Payer: Self-pay | Admitting: Cardiology

## 2013-03-15 NOTE — Telephone Encounter (Signed)
No answer 8/26

## 2013-03-15 NOTE — Telephone Encounter (Signed)
Noted! Thank you

## 2013-03-23 ENCOUNTER — Other Ambulatory Visit: Payer: Medicare Other

## 2013-03-23 ENCOUNTER — Other Ambulatory Visit (HOSPITAL_COMMUNITY): Payer: Medicare Other

## 2013-03-25 ENCOUNTER — Other Ambulatory Visit (INDEPENDENT_AMBULATORY_CARE_PROVIDER_SITE_OTHER): Payer: Medicare Other

## 2013-03-25 DIAGNOSIS — IMO0001 Reserved for inherently not codable concepts without codable children: Secondary | ICD-10-CM

## 2013-03-25 LAB — URINALYSIS
Bilirubin Urine: NEGATIVE
Total Protein, Urine: NEGATIVE
Urine Glucose: NEGATIVE
Urobilinogen, UA: 0.2 (ref 0.0–1.0)

## 2013-03-25 LAB — COMPREHENSIVE METABOLIC PANEL
ALT: 42 U/L (ref 0–53)
CO2: 28 mEq/L (ref 19–32)
Calcium: 9.7 mg/dL (ref 8.4–10.5)
Chloride: 102 mEq/L (ref 96–112)
Creatinine, Ser: 1 mg/dL (ref 0.4–1.5)
GFR: 94.42 mL/min (ref >60.00)
Glucose, Bld: 119 mg/dL — ABNORMAL HIGH (ref 70–99)

## 2013-03-25 LAB — HEMOGLOBIN A1C: Hgb A1c MFr Bld: 6.9 % — ABNORMAL HIGH (ref 4.6–6.5)

## 2013-03-29 ENCOUNTER — Other Ambulatory Visit (HOSPITAL_COMMUNITY): Payer: Medicare Other

## 2013-03-30 ENCOUNTER — Ambulatory Visit: Payer: Medicare Other | Admitting: Endocrinology

## 2013-04-25 ENCOUNTER — Ambulatory Visit: Payer: Medicare Other | Admitting: Endocrinology

## 2013-04-26 ENCOUNTER — Other Ambulatory Visit (HOSPITAL_COMMUNITY): Payer: Medicare Other

## 2013-05-27 ENCOUNTER — Ambulatory Visit: Payer: Medicare Other

## 2015-10-10 ENCOUNTER — Encounter (HOSPITAL_COMMUNITY): Payer: Self-pay | Admitting: Emergency Medicine

## 2015-10-10 ENCOUNTER — Emergency Department (HOSPITAL_COMMUNITY): Payer: Medicare Other

## 2015-10-10 ENCOUNTER — Ambulatory Visit (HOSPITAL_COMMUNITY): Admit: 2015-10-10 | Payer: Self-pay | Admitting: Cardiovascular Disease

## 2015-10-10 ENCOUNTER — Inpatient Hospital Stay (HOSPITAL_COMMUNITY)
Admission: EM | Admit: 2015-10-10 | Discharge: 2015-10-20 | DRG: 270 | Disposition: E | Payer: Medicare Other | Attending: Pulmonary Disease | Admitting: Pulmonary Disease

## 2015-10-10 ENCOUNTER — Inpatient Hospital Stay (HOSPITAL_COMMUNITY): Payer: Medicare Other

## 2015-10-10 ENCOUNTER — Encounter (HOSPITAL_COMMUNITY): Admission: EM | Disposition: E | Payer: Self-pay | Source: Home / Self Care | Attending: Cardiovascular Disease

## 2015-10-10 DIAGNOSIS — Z66 Do not resuscitate: Secondary | ICD-10-CM | POA: Diagnosis not present

## 2015-10-10 DIAGNOSIS — G9340 Encephalopathy, unspecified: Secondary | ICD-10-CM | POA: Insufficient documentation

## 2015-10-10 DIAGNOSIS — G931 Anoxic brain damage, not elsewhere classified: Secondary | ICD-10-CM | POA: Insufficient documentation

## 2015-10-10 DIAGNOSIS — G40901 Epilepsy, unspecified, not intractable, with status epilepticus: Secondary | ICD-10-CM | POA: Diagnosis not present

## 2015-10-10 DIAGNOSIS — I469 Cardiac arrest, cause unspecified: Secondary | ICD-10-CM | POA: Insufficient documentation

## 2015-10-10 DIAGNOSIS — I5021 Acute systolic (congestive) heart failure: Secondary | ICD-10-CM | POA: Diagnosis present

## 2015-10-10 DIAGNOSIS — I471 Supraventricular tachycardia: Secondary | ICD-10-CM | POA: Diagnosis not present

## 2015-10-10 DIAGNOSIS — I6782 Cerebral ischemia: Secondary | ICD-10-CM | POA: Diagnosis not present

## 2015-10-10 DIAGNOSIS — I2510 Atherosclerotic heart disease of native coronary artery without angina pectoris: Secondary | ICD-10-CM | POA: Diagnosis present

## 2015-10-10 DIAGNOSIS — Z6827 Body mass index (BMI) 27.0-27.9, adult: Secondary | ICD-10-CM | POA: Diagnosis not present

## 2015-10-10 DIAGNOSIS — I4901 Ventricular fibrillation: Secondary | ICD-10-CM | POA: Diagnosis present

## 2015-10-10 DIAGNOSIS — Z809 Family history of malignant neoplasm, unspecified: Secondary | ICD-10-CM | POA: Diagnosis not present

## 2015-10-10 DIAGNOSIS — E870 Hyperosmolality and hypernatremia: Secondary | ICD-10-CM | POA: Diagnosis not present

## 2015-10-10 DIAGNOSIS — I462 Cardiac arrest due to underlying cardiac condition: Secondary | ICD-10-CM | POA: Diagnosis present

## 2015-10-10 DIAGNOSIS — I213 ST elevation (STEMI) myocardial infarction of unspecified site: Secondary | ICD-10-CM | POA: Insufficient documentation

## 2015-10-10 DIAGNOSIS — J9622 Acute and chronic respiratory failure with hypercapnia: Secondary | ICD-10-CM | POA: Diagnosis present

## 2015-10-10 DIAGNOSIS — I2119 ST elevation (STEMI) myocardial infarction involving other coronary artery of inferior wall: Secondary | ICD-10-CM | POA: Diagnosis not present

## 2015-10-10 DIAGNOSIS — N179 Acute kidney failure, unspecified: Secondary | ICD-10-CM | POA: Diagnosis not present

## 2015-10-10 DIAGNOSIS — Z8249 Family history of ischemic heart disease and other diseases of the circulatory system: Secondary | ICD-10-CM | POA: Diagnosis not present

## 2015-10-10 DIAGNOSIS — Z888 Allergy status to other drugs, medicaments and biological substances status: Secondary | ICD-10-CM | POA: Diagnosis not present

## 2015-10-10 DIAGNOSIS — R402212 Coma scale, best verbal response, none, at arrival to emergency department: Secondary | ICD-10-CM | POA: Diagnosis present

## 2015-10-10 DIAGNOSIS — E669 Obesity, unspecified: Secondary | ICD-10-CM | POA: Diagnosis present

## 2015-10-10 DIAGNOSIS — F419 Anxiety disorder, unspecified: Secondary | ICD-10-CM | POA: Diagnosis present

## 2015-10-10 DIAGNOSIS — R402312 Coma scale, best motor response, none, at arrival to emergency department: Secondary | ICD-10-CM | POA: Diagnosis present

## 2015-10-10 DIAGNOSIS — E119 Type 2 diabetes mellitus without complications: Secondary | ICD-10-CM | POA: Diagnosis present

## 2015-10-10 DIAGNOSIS — M1990 Unspecified osteoarthritis, unspecified site: Secondary | ICD-10-CM | POA: Diagnosis present

## 2015-10-10 DIAGNOSIS — I110 Hypertensive heart disease with heart failure: Secondary | ICD-10-CM | POA: Diagnosis present

## 2015-10-10 DIAGNOSIS — D696 Thrombocytopenia, unspecified: Secondary | ICD-10-CM | POA: Diagnosis not present

## 2015-10-10 DIAGNOSIS — J14 Pneumonia due to Hemophilus influenzae: Secondary | ICD-10-CM | POA: Diagnosis not present

## 2015-10-10 DIAGNOSIS — J9620 Acute and chronic respiratory failure, unspecified whether with hypoxia or hypercapnia: Secondary | ICD-10-CM | POA: Insufficient documentation

## 2015-10-10 DIAGNOSIS — I255 Ischemic cardiomyopathy: Secondary | ICD-10-CM | POA: Diagnosis present

## 2015-10-10 DIAGNOSIS — Z4659 Encounter for fitting and adjustment of other gastrointestinal appliance and device: Secondary | ICD-10-CM

## 2015-10-10 DIAGNOSIS — D649 Anemia, unspecified: Secondary | ICD-10-CM | POA: Diagnosis present

## 2015-10-10 DIAGNOSIS — Z794 Long term (current) use of insulin: Secondary | ICD-10-CM

## 2015-10-10 DIAGNOSIS — R570 Cardiogenic shock: Secondary | ICD-10-CM

## 2015-10-10 DIAGNOSIS — J9621 Acute and chronic respiratory failure with hypoxia: Secondary | ICD-10-CM | POA: Diagnosis present

## 2015-10-10 DIAGNOSIS — E785 Hyperlipidemia, unspecified: Secondary | ICD-10-CM | POA: Diagnosis present

## 2015-10-10 DIAGNOSIS — G9341 Metabolic encephalopathy: Secondary | ICD-10-CM | POA: Diagnosis present

## 2015-10-10 DIAGNOSIS — I2129 ST elevation (STEMI) myocardial infarction involving other sites: Secondary | ICD-10-CM | POA: Diagnosis not present

## 2015-10-10 DIAGNOSIS — E876 Hypokalemia: Secondary | ICD-10-CM | POA: Diagnosis present

## 2015-10-10 DIAGNOSIS — I2109 ST elevation (STEMI) myocardial infarction involving other coronary artery of anterior wall: Principal | ICD-10-CM | POA: Diagnosis present

## 2015-10-10 DIAGNOSIS — J9600 Acute respiratory failure, unspecified whether with hypoxia or hypercapnia: Secondary | ICD-10-CM | POA: Insufficient documentation

## 2015-10-10 DIAGNOSIS — Z452 Encounter for adjustment and management of vascular access device: Secondary | ICD-10-CM

## 2015-10-10 DIAGNOSIS — R402112 Coma scale, eyes open, never, at arrival to emergency department: Secondary | ICD-10-CM | POA: Diagnosis present

## 2015-10-10 DIAGNOSIS — E872 Acidosis: Secondary | ICD-10-CM | POA: Diagnosis present

## 2015-10-10 DIAGNOSIS — Z9289 Personal history of other medical treatment: Secondary | ICD-10-CM

## 2015-10-10 DIAGNOSIS — R34 Anuria and oliguria: Secondary | ICD-10-CM | POA: Diagnosis not present

## 2015-10-10 DIAGNOSIS — E878 Other disorders of electrolyte and fluid balance, not elsewhere classified: Secondary | ICD-10-CM | POA: Diagnosis not present

## 2015-10-10 DIAGNOSIS — R109 Unspecified abdominal pain: Secondary | ICD-10-CM

## 2015-10-10 DIAGNOSIS — R569 Unspecified convulsions: Secondary | ICD-10-CM | POA: Diagnosis not present

## 2015-10-10 DIAGNOSIS — T17990A Other foreign object in respiratory tract, part unspecified in causing asphyxiation, initial encounter: Secondary | ICD-10-CM | POA: Diagnosis not present

## 2015-10-10 DIAGNOSIS — E8729 Other acidosis: Secondary | ICD-10-CM | POA: Insufficient documentation

## 2015-10-10 HISTORY — PX: CARDIAC CATHETERIZATION: SHX172

## 2015-10-10 LAB — I-STAT CG4 LACTIC ACID, ED: Lactic Acid, Venous: 7.67 mmol/L (ref 0.5–2.0)

## 2015-10-10 LAB — COMPREHENSIVE METABOLIC PANEL
ALT: 166 U/L — AB (ref 17–63)
ANION GAP: 17 — AB (ref 5–15)
AST: 150 U/L — ABNORMAL HIGH (ref 15–41)
Albumin: 3.4 g/dL — ABNORMAL LOW (ref 3.5–5.0)
Alkaline Phosphatase: 72 U/L (ref 38–126)
BUN: 16 mg/dL (ref 6–20)
CHLORIDE: 104 mmol/L (ref 101–111)
CO2: 18 mmol/L — AB (ref 22–32)
CREATININE: 1.38 mg/dL — AB (ref 0.61–1.24)
Calcium: 8.8 mg/dL — ABNORMAL LOW (ref 8.9–10.3)
GFR calc non Af Amer: 53 mL/min — ABNORMAL LOW (ref >60)
Glucose, Bld: 398 mg/dL — ABNORMAL HIGH (ref 65–99)
POTASSIUM: 3.3 mmol/L — AB (ref 3.5–5.1)
SODIUM: 139 mmol/L (ref 135–145)
Total Bilirubin: 0.8 mg/dL (ref 0.3–1.2)
Total Protein: 7.2 g/dL (ref 6.5–8.1)

## 2015-10-10 LAB — GLUCOSE, CAPILLARY: GLUCOSE-CAPILLARY: 307 mg/dL — AB (ref 65–99)

## 2015-10-10 LAB — CBC WITH DIFFERENTIAL/PLATELET
Basophils Absolute: 0 10*3/uL (ref 0.0–0.1)
Basophils Relative: 0 %
Eosinophils Absolute: 0.1 10*3/uL (ref 0.0–0.7)
Eosinophils Relative: 1 %
HEMATOCRIT: 41.1 % (ref 39.0–52.0)
HEMOGLOBIN: 13.1 g/dL (ref 13.0–17.0)
LYMPHS ABS: 4.6 10*3/uL — AB (ref 0.7–4.0)
LYMPHS PCT: 51 %
MCH: 30 pg (ref 26.0–34.0)
MCHC: 31.9 g/dL (ref 30.0–36.0)
MCV: 94.3 fL (ref 78.0–100.0)
MONOS PCT: 5 %
Monocytes Absolute: 0.4 10*3/uL (ref 0.1–1.0)
NEUTROS ABS: 3.9 10*3/uL (ref 1.7–7.7)
NEUTROS PCT: 43 %
Platelets: 192 10*3/uL (ref 150–400)
RBC: 4.36 MIL/uL (ref 4.22–5.81)
RDW: 12.6 % (ref 11.5–15.5)
WBC: 8.9 10*3/uL (ref 4.0–10.5)

## 2015-10-10 LAB — POCT I-STAT 3, ART BLOOD GAS (G3+)
ACID-BASE DEFICIT: 4 mmol/L — AB (ref 0.0–2.0)
Bicarbonate: 20.2 mEq/L (ref 20.0–24.0)
O2 Saturation: 100 %
TCO2: 21 mmol/L (ref 0–100)
pCO2 arterial: 32.7 mmHg — ABNORMAL LOW (ref 35.0–45.0)
pH, Arterial: 7.394 (ref 7.350–7.450)
pO2, Arterial: 401 mmHg — ABNORMAL HIGH (ref 80.0–100.0)

## 2015-10-10 LAB — BASIC METABOLIC PANEL
ANION GAP: 13 (ref 5–15)
BUN: 15 mg/dL (ref 6–20)
CO2: 16 mmol/L — AB (ref 22–32)
Calcium: 8 mg/dL — ABNORMAL LOW (ref 8.9–10.3)
Chloride: 107 mmol/L (ref 101–111)
Creatinine, Ser: 1.19 mg/dL (ref 0.61–1.24)
GFR calc non Af Amer: >60 mL/min (ref >60)
GLUCOSE: 336 mg/dL — AB (ref 65–99)
POTASSIUM: 4.6 mmol/L (ref 3.5–5.1)
Sodium: 136 mmol/L (ref 135–145)

## 2015-10-10 LAB — PROTIME-INR
INR: 1.2 (ref 0.00–1.49)
Prothrombin Time: 15.4 seconds — ABNORMAL HIGH (ref 11.6–15.2)

## 2015-10-10 LAB — I-STAT CHEM 8, ED
BUN: 19 mg/dL (ref 6–20)
CALCIUM ION: 1.11 mmol/L — AB (ref 1.13–1.30)
CHLORIDE: 103 mmol/L (ref 101–111)
CREATININE: 1 mg/dL (ref 0.61–1.24)
Glucose, Bld: 380 mg/dL — ABNORMAL HIGH (ref 65–99)
HCT: 45 % (ref 39.0–52.0)
Hemoglobin: 15.3 g/dL (ref 13.0–17.0)
Potassium: 3.2 mmol/L — ABNORMAL LOW (ref 3.5–5.1)
Sodium: 139 mmol/L (ref 135–145)
TCO2: 19 mmol/L (ref 0–100)

## 2015-10-10 LAB — POCT I-STAT, CHEM 8
BUN: 16 mg/dL (ref 6–20)
CREATININE: 0.9 mg/dL (ref 0.61–1.24)
Calcium, Ion: 1.08 mmol/L — ABNORMAL LOW (ref 1.13–1.30)
Chloride: 106 mmol/L (ref 101–111)
GLUCOSE: 335 mg/dL — AB (ref 65–99)
HCT: 39 % (ref 39.0–52.0)
HEMOGLOBIN: 13.3 g/dL (ref 13.0–17.0)
Potassium: 4.6 mmol/L (ref 3.5–5.1)
Sodium: 139 mmol/L (ref 135–145)
TCO2: 22 mmol/L (ref 0–100)

## 2015-10-10 LAB — LACTIC ACID, PLASMA: LACTIC ACID, VENOUS: 2.2 mmol/L — AB (ref 0.5–2.0)

## 2015-10-10 LAB — TROPONIN I: Troponin I: 0.07 ng/mL — ABNORMAL HIGH (ref <0.031)

## 2015-10-10 LAB — APTT: APTT: 58 s — AB (ref 24–37)

## 2015-10-10 SURGERY — LEFT HEART CATH AND CORONARY ANGIOGRAPHY
Anesthesia: LOCAL

## 2015-10-10 MED ORDER — CISATRACURIUM BOLUS VIA INFUSION
0.1000 mg/kg | Freq: Once | INTRAVENOUS | Status: AC
Start: 2015-10-10 — End: 2015-10-10
  Administered 2015-10-10: 10.9 mg via INTRAVENOUS
  Filled 2015-10-10: qty 11

## 2015-10-10 MED ORDER — SODIUM CHLORIDE 0.9 % IV SOLN: 250.0000 mL | INTRAVENOUS | Status: DC | PRN

## 2015-10-10 MED ORDER — MIDAZOLAM BOLUS VIA INFUSION
1.0000 mg | INTRAVENOUS | Status: DC | PRN
  Filled 2015-10-10: qty 1

## 2015-10-10 MED ORDER — ONDANSETRON HCL 4 MG/2ML IJ SOLN: 4.0000 mg | Freq: Four times a day (QID) | INTRAMUSCULAR | Status: DC | PRN

## 2015-10-10 MED ORDER — NOREPINEPHRINE BITARTRATE 1 MG/ML IV SOLN
INTRAVENOUS | Status: AC
  Filled 2015-10-10: qty 4

## 2015-10-10 MED ORDER — CISATRACURIUM BOLUS VIA INFUSION
0.0500 mg/kg | INTRAVENOUS | Status: DC | PRN
  Filled 2015-10-10: qty 6

## 2015-10-10 MED ORDER — SODIUM CHLORIDE 0.9 % IV SOLN: 2000.0000 mL | Freq: Once | INTRAVENOUS | Status: DC

## 2015-10-10 MED ORDER — HEPARIN (PORCINE) IN NACL 2-0.9 UNIT/ML-% IJ SOLN
INTRAMUSCULAR | Status: DC | PRN
  Administered 2015-10-10: 1500 mL

## 2015-10-10 MED ORDER — ETOMIDATE 2 MG/ML IV SOLN
0.3000 mg/kg | Freq: Once | INTRAVENOUS | Status: AC
  Administered 2015-10-10: 20 mg via INTRAVENOUS

## 2015-10-10 MED ORDER — SODIUM CHLORIDE 0.9 % IV SOLN
1.0000 mg/h | INTRAVENOUS | Status: DC
  Administered 2015-10-10: 4 mg/h via INTRAVENOUS
  Administered 2015-10-11: 8 mg/h via INTRAVENOUS
  Filled 2015-10-10 (×2): qty 10

## 2015-10-10 MED ORDER — SODIUM CHLORIDE 0.9 % IV SOLN
1.0000 mg/h | INTRAVENOUS | Status: DC
  Administered 2015-10-10: 2 mg/h via INTRAVENOUS
  Filled 2015-10-10: qty 10

## 2015-10-10 MED ORDER — IOPAMIDOL (ISOVUE-370) INJECTION 76%
INTRAVENOUS | Status: AC
  Filled 2015-10-10: qty 50

## 2015-10-10 MED ORDER — SODIUM CHLORIDE 0.9 % IV BOLUS (SEPSIS)
2000.0000 mL | Freq: Once | INTRAVENOUS | Status: AC
  Administered 2015-10-10: 2000 mL via INTRAVENOUS

## 2015-10-10 MED ORDER — DEXTROSE 5 % IV SOLN
4.0000 mg | INTRAVENOUS | Status: DC | PRN
  Administered 2015-10-10: 8 ug/min via INTRAVENOUS

## 2015-10-10 MED ORDER — LIDOCAINE HCL (PF) 1 % IJ SOLN
INTRAMUSCULAR | Status: AC
  Filled 2015-10-10: qty 30

## 2015-10-10 MED ORDER — FENTANYL CITRATE (PF) 100 MCG/2ML IJ SOLN: 50.0000 ug | Freq: Once | INTRAMUSCULAR | Status: AC

## 2015-10-10 MED ORDER — SODIUM CHLORIDE 0.9% FLUSH: 3.0000 mL | INTRAVENOUS | Status: DC | PRN

## 2015-10-10 MED ORDER — POTASSIUM CHLORIDE 10 MEQ/50ML IV SOLN
10.0000 meq | Freq: Once | INTRAVENOUS | Status: AC
  Administered 2015-10-10: 10 meq via INTRAVENOUS
  Filled 2015-10-10: qty 50

## 2015-10-10 MED ORDER — INSULIN ASPART 100 UNIT/ML ~~LOC~~ SOLN: 2.0000 [IU] | SUBCUTANEOUS | Status: DC

## 2015-10-10 MED ORDER — MIDAZOLAM HCL 2 MG/2ML IJ SOLN: 1.0000 mg | Freq: Once | INTRAMUSCULAR | Status: AC

## 2015-10-10 MED ORDER — ASPIRIN 81 MG PO CHEW
81.0000 mg | CHEWABLE_TABLET | Freq: Every day | ORAL | Status: DC
  Administered 2015-10-11 – 2015-10-18 (×8): 81 mg via ORAL
  Filled 2015-10-10 (×8): qty 1

## 2015-10-10 MED ORDER — HEPARIN (PORCINE) IN NACL 2-0.9 UNIT/ML-% IJ SOLN
INTRAMUSCULAR | Status: AC
  Filled 2015-10-10: qty 500

## 2015-10-10 MED ORDER — SODIUM CHLORIDE 0.9 % IV SOLN
1.0000 ug/kg/min | INTRAVENOUS | Status: DC
  Filled 2015-10-10: qty 20

## 2015-10-10 MED ORDER — ARTIFICIAL TEARS OP OINT
1.0000 "application " | TOPICAL_OINTMENT | Freq: Three times a day (TID) | OPHTHALMIC | Status: DC
  Administered 2015-10-11 – 2015-10-15 (×14): 1 via OPHTHALMIC
  Filled 2015-10-10: qty 3.5

## 2015-10-10 MED ORDER — ASPIRIN 300 MG RE SUPP
300.0000 mg | RECTAL | Status: AC
  Administered 2015-10-10: 300 mg via RECTAL
  Filled 2015-10-10: qty 1

## 2015-10-10 MED ORDER — ATORVASTATIN CALCIUM 80 MG PO TABS
80.0000 mg | ORAL_TABLET | Freq: Every day | ORAL | Status: DC
  Administered 2015-10-11 – 2015-10-18 (×8): 80 mg via ORAL
  Filled 2015-10-10: qty 1
  Filled 2015-10-10: qty 2
  Filled 2015-10-10 (×6): qty 1

## 2015-10-10 MED ORDER — ASPIRIN 300 MG RE SUPP: 300.0000 mg | Freq: Once | RECTAL | Status: DC

## 2015-10-10 MED ORDER — ACETAMINOPHEN 325 MG PO TABS
650.0000 mg | ORAL_TABLET | ORAL | Status: DC | PRN
  Administered 2015-10-15 (×2): 650 mg via ORAL
  Filled 2015-10-10 (×2): qty 2

## 2015-10-10 MED ORDER — FENTANYL BOLUS VIA INFUSION
25.0000 ug | INTRAVENOUS | Status: DC | PRN
  Filled 2015-10-10: qty 25

## 2015-10-10 MED ORDER — SODIUM CHLORIDE 0.9 % IV SOLN
INTRAVENOUS | Status: DC | PRN
  Administered 2015-10-10: 50 mL/h via INTRAVENOUS

## 2015-10-10 MED ORDER — HEPARIN (PORCINE) IN NACL 100-0.45 UNIT/ML-% IJ SOLN
750.0000 [IU]/h | INTRAMUSCULAR | Status: DC
  Administered 2015-10-10 – 2015-10-11 (×2): 900 [IU]/h via INTRAVENOUS
  Administered 2015-10-12 – 2015-10-13 (×2): 1000 [IU]/h via INTRAVENOUS
  Administered 2015-10-15 – 2015-10-16 (×3): 1100 [IU]/h via INTRAVENOUS
  Filled 2015-10-10 (×8): qty 250

## 2015-10-10 MED ORDER — SODIUM CHLORIDE 0.9 % IV SOLN
1.0000 ug/kg/min | INTRAVENOUS | Status: DC
  Administered 2015-10-10 – 2015-10-11 (×2): 1 ug/kg/min via INTRAVENOUS
  Filled 2015-10-10: qty 20

## 2015-10-10 MED ORDER — HEPARIN (PORCINE) IN NACL 2-0.9 UNIT/ML-% IJ SOLN
INTRAMUSCULAR | Status: AC
  Filled 2015-10-10: qty 1000

## 2015-10-10 MED ORDER — SODIUM CHLORIDE 0.9 % IV SOLN
10.0000 ug/h | INTRAVENOUS | Status: DC
  Administered 2015-10-10: 50 ug/h via INTRAVENOUS
  Filled 2015-10-10: qty 50

## 2015-10-10 MED ORDER — HEPARIN SODIUM (PORCINE) 1000 UNIT/ML IJ SOLN
INTRAMUSCULAR | Status: DC | PRN
  Administered 2015-10-10: 5000 [IU] via INTRAVENOUS

## 2015-10-10 MED ORDER — SODIUM CHLORIDE 0.9 % IV SOLN
25.0000 ug/h | INTRAVENOUS | Status: DC
  Administered 2015-10-10: 200 ug/h via INTRAVENOUS
  Administered 2015-10-11: 350 ug/h via INTRAVENOUS
  Administered 2015-10-11: 400 ug/h via INTRAVENOUS
  Administered 2015-10-11: 350 ug/h via INTRAVENOUS
  Administered 2015-10-12 – 2015-10-15 (×12): 400 ug/h via INTRAVENOUS
  Filled 2015-10-10 (×17): qty 50

## 2015-10-10 MED ORDER — SODIUM CHLORIDE 0.9 % IV SOLN
INTRAVENOUS | Status: DC | PRN
  Administered 2015-10-10: 999 mL via INTRAVENOUS

## 2015-10-10 MED ORDER — HEPARIN SODIUM (PORCINE) 1000 UNIT/ML IJ SOLN
INTRAMUSCULAR | Status: AC
  Filled 2015-10-10: qty 1

## 2015-10-10 MED ORDER — ROCURONIUM BROMIDE 100 MG/10ML IV SOLN
1.0000 mg/kg | Freq: Once | INTRAVENOUS | Status: AC
  Administered 2015-10-10: 80 mg via INTRAVENOUS

## 2015-10-10 MED ORDER — IOPAMIDOL (ISOVUE-370) INJECTION 76%
INTRAVENOUS | Status: DC | PRN
  Administered 2015-10-10: 110 mL via INTRAVENOUS

## 2015-10-10 MED ORDER — SODIUM CHLORIDE 0.9 % IV SOLN
INTRAVENOUS | Status: DC
  Administered 2015-10-10: 23:00:00 via INTRAVENOUS
  Administered 2015-10-12: 10 mL/h via INTRAVENOUS

## 2015-10-10 MED ORDER — CISATRACURIUM BOLUS VIA INFUSION
0.0500 mg/kg | INTRAVENOUS | Status: DC | PRN
  Filled 2015-10-10: qty 5

## 2015-10-10 MED ORDER — LIDOCAINE HCL (PF) 1 % IJ SOLN
INTRAMUSCULAR | Status: DC | PRN
  Administered 2015-10-10: 15 mL

## 2015-10-10 MED ORDER — IOPAMIDOL (ISOVUE-370) INJECTION 76%
INTRAVENOUS | Status: AC
  Filled 2015-10-10: qty 200

## 2015-10-10 MED ORDER — SODIUM CHLORIDE 0.9% FLUSH
3.0000 mL | Freq: Two times a day (BID) | INTRAVENOUS | Status: DC
  Administered 2015-10-11: 3 mL via INTRAVENOUS

## 2015-10-10 MED ORDER — PANTOPRAZOLE SODIUM 40 MG IV SOLR
40.0000 mg | Freq: Every day | INTRAVENOUS | Status: DC
Start: 2015-10-10 — End: 2015-10-13
  Administered 2015-10-11 – 2015-10-12 (×3): 40 mg via INTRAVENOUS
  Filled 2015-10-10 (×3): qty 40

## 2015-10-10 MED ORDER — NOREPINEPHRINE BITARTRATE 1 MG/ML IV SOLN
0.0000 ug/min | INTRAVENOUS | Status: DC
  Administered 2015-10-10: 25 ug/min via INTRAVENOUS
  Administered 2015-10-11: 0 ug/min via INTRAVENOUS
  Administered 2015-10-12: 14 ug/min via INTRAVENOUS
  Administered 2015-10-12: 2 ug/min via INTRAVENOUS
  Filled 2015-10-10 (×3): qty 4

## 2015-10-10 SURGICAL SUPPLY — 14 items
BALLN LINEAR 7.5FR IABP 40CC (BALLOONS) ×2
BALLOON LINEAR 7.5FR IABP 40CC (BALLOONS) ×1 IMPLANT
CATH INFINITI 5FR JL5 (CATHETERS) ×2 IMPLANT
CATH INFINITI 5FR MULTPACK ANG (CATHETERS) ×2 IMPLANT
DEVICE SECURE STATLOCK IABP (MISCELLANEOUS) ×4 IMPLANT
HOVERMATT SINGLE USE (MISCELLANEOUS) ×2 IMPLANT
KIT ENCORE 26 ADVANTAGE (KITS) ×2 IMPLANT
KIT HEART LEFT (KITS) ×2 IMPLANT
PACK CARDIAC CATHETERIZATION (CUSTOM PROCEDURE TRAY) ×2 IMPLANT
SHEATH PINNACLE 6F 10CM (SHEATH) ×2 IMPLANT
SYR MEDRAD MARK V 150ML (SYRINGE) ×2 IMPLANT
TRANSDUCER W/STOPCOCK (MISCELLANEOUS) ×2 IMPLANT
TUBING CIL FLEX 10 FLL-RA (TUBING) ×2 IMPLANT
WIRE EMERALD 3MM-J .035X150CM (WIRE) ×2 IMPLANT

## 2015-10-10 NOTE — Progress Notes (Signed)
eLink Physician-Brief Progress Note Patient Name: Clarence MaudlinWilson Brooks DOB: 1951-02-08 MRN: [id number]   Date of Service  Sep 14, 2015  HPI/Events of Note  Reviewed portable chest x-ray & abdominal x-ray post line and tube placement.No evidence of pneumothorax. Endotracheal tube & central venous catheter in acceptable position. Enteric feeding tube appears to be in the stomach and is a cat in acceptable position.  eICU Interventions  Okay to use central venous catheter & enteric feeding tube.     Intervention Category Intermediate Interventions: Diagnostic test evaluation  Clarence CousinsJennings Hinata Brooks Sep 14, 2015, 10:52 PM

## 2015-10-10 NOTE — Progress Notes (Signed)
Patient transported to cathlab on ventilator with no complications.

## 2015-10-10 NOTE — ED Notes (Signed)
Pt transported to cath lab.  

## 2015-10-10 NOTE — H&P (Signed)
PULMONARY / CRITICAL CARE MEDICINE   Name: Clarence Brooks MRN: [id number] DOB: August 28, 1950    ADMISSION DATE:  10/15/2015 CONSULTATION DATE:  10/17/2015  REFERRING MD:  Claiborne Billings  CHIEF COMPLAINT:  Cardiac Arrest  HISTORY OF PRESENT ILLNESS:  Pt is encephelopathic; therefore, this HPI is obtained from chart review. Clarence Brooks is a 65 y.o. male with PMH as outlined below including HTN, HLD, DM2, obesity.  He was brought to [facility] ED 03/22 via EMS after a witnessed out of hospital VF arrest with EKG revealing anterolateral STEMI.  CPR was apparently immediately started on the scene and on EMS arrival, he was given 2 rounds of epi as well as 1 defibrillation prior to ROSC.  He had king airway placed in the field and this was switched to ETT in ED.  On arrival to ED, he was met by cardiology team who took him for emergent cardiac cath. Per RN report, pt had severe 3 vessel disease that was not amenable to PCI.  He had IABP placed for EF of roughly 25% and will need CVTS input.  Following cath, he returned to the ICU and hypothermia protocol was initiated.  PAST MEDICAL HISTORY :  He  has a past medical history of Diabetes mellitus without complication ([place]); Hypertension; Dyslipidemia; Dizziness; Anxiety; Osteoarthritis; Special screening for malignant neoplasm of prostate; and Encounter for long-term (current) use of other medications.  PAST SURGICAL HISTORY: He  has past surgical history that includes Knee surgery; Umbilical hernia repair; Carpal tunnel release; and Ankle surgery.  Allergies  Allergen Reactions  . Ace Inhibitors Swelling  . Lisinopril     Throat swelling     No current facility-administered medications on file prior to encounter.   Current Outpatient Prescriptions on File Prior to Encounter  Medication Sig  . AMLODIPINE BESYLATE PO Take 10 mg by mouth daily.  . hydrochlorothiazide (HYDRODIURIL) 25 MG tablet Take 25 mg by mouth daily.  . insulin aspart (NOVOLOG) 100 UNIT/ML  injection Inject 15 Units into the skin 3 (three) times daily with meals.  . Insulin Glargine (LANTUS OPTICLIK) 100 UNIT/ML SOCT Inject 28 Units into the skin daily.  . Multiple Vitamins-Minerals (EQ COMPLETE MULTIVIT ADULT 50+ PO) Take by mouth.    FAMILY HISTORY:  His indicated that his mother is alive. He indicated that his father is deceased. He indicated that his brother is alive.   SOCIAL HISTORY: He  reports that he has never smoked. He does not have any smokeless tobacco history on file.  REVIEW OF SYSTEMS:   Unable to obtain as pt is encephalopathic.  SUBJECTIVE:  On vent, unresponsive.  VITAL SIGNS: BP 144/88 mmHg  Pulse 99  Resp 16  Wt 109 kg (240 lb 4.8 oz)  SpO2 100%  HEMODYNAMICS:    VENTILATOR SETTINGS: Vent Mode:  [-] PRVC FiO2 (%):  [100 %] 100 % Set Rate:  [15 bmp] 15 bmp Vt Set:  [600 mL] 600 mL PEEP:  [5 cmH20] 5 cmH20 Plateau Pressure:  [17 cmH20] 17 cmH20  INTAKE / OUTPUT:     PHYSICAL EXAMINATION: General: Adult AA male, resting in bed, critically ill. Neuro: Sedated, does not follow commands. HEENT: [place]/AT. PERRL, sclerae anicteric. Cardiovascular: RRR, no M/R/G.  Lungs: Respirations even and unlabored.  CTA bilaterally, No W/R/R. Abdomen: BS x 4, soft, NT/ND.  Musculoskeletal: No gross deformities, no edema.  Skin: Intact, warm, no rashes.     LABS:  BMET  Recent Labs Lab 10/17/2015 1928 10/09/2015 1936  NA 139  139  K 3.3* 3.2*  CL 104 103  CO2 18*  --   BUN 16 19  CREATININE 1.38* 1.00  GLUCOSE 398* 380*    Electrolytes  Recent Labs Lab 10/12/2015 1928  CALCIUM 8.8*    CBC  Recent Labs Lab 10/01/2015 1928 09/26/2015 1936  WBC 8.9  --   HGB 13.1 15.3  HCT 41.1 45.0  PLT 192  --     Coag's No results for input(s): APTT, INR in the last 168 hours.  Sepsis Markers  Recent Labs Lab 09/27/2015 1936  LATICACIDVEN 7.67*    ABG No results for input(s): PHART, PCO2ART, PO2ART in the last 168 hours.  Liver  Enzymes  Recent Labs Lab 09/30/2015 1928  AST 150*  ALT 166*  ALKPHOS 72  BILITOT 0.8  ALBUMIN 3.4*    Cardiac Enzymes No results for input(s): TROPONINI, PROBNP in the last 168 hours.  Glucose No results for input(s): GLUCAP in the last 168 hours.  Imaging Dg Chest Port 1 View  10/13/2015  CLINICAL DATA:  Found unresponsive. Status post CPR. Status post intubation. Acute respiratory failure. EXAM: PORTABLE CHEST 1 VIEW COMPARISON:  09/26/2010 FINDINGS: Technically suboptimal exam due to patient positioning. Endotracheal tube is seen with tip approximately 4 cm above the carina. Heart size remains within normal limits allowing for positioning. No evidence of pulmonary consolidation to or pleural effusion. Although the lung apices are not visualized, no definite pneumothorax seen. IMPRESSION: Technically suboptimal exam. Endotracheal tube in appropriate position. No other acute findings. Electronically Signed   By: Earle Gell M.D.   On: 10/09/2015 19:45     STUDIES:  CXR 03/22 > no acute process.  CULTURES: None.  ANTIBIOTICS: None.  SIGNIFICANT EVENTS: 03/22 > admitted after out of hospital VF arrest.    LINES/TUBES: ETT 03/22 > CVL pending 03/22 > A line pending 03/22 >  DISCUSSION: 65 y.o. M admitted 03/22 after out of hospital VF arrest.  He was taken to cath lab for emergent cardiac cath and per RN, this revealed severe 3 vessel disease not amenable to PCI.  He had EF of 25% so IABP was placed. Following procedure, he returned to ICU where hypothermia protocol was initiated.  ASSESSMENT / PLAN:  CARDIOVASCULAR A:  Out of hospital VF arrest - s/p cardiac cath with reported severe 3 vessel disease not amenable to PCI.  He had EF of 25% so IABP was placed. sCHF - reported EF of ~25% during cardiac cath - official report pending. Hx HTN, HLD. P:  Initiate hypothermia protocol, goal temp 33C. Goal MAP > 80 during hypothermia protocol. Levophed as needed for above  goal. Trend troponins / lactate. Assess echo. Cardiology following. Will need CVTS consult for CABG evaluation.  NEUROLOGIC A:   Acute metabolic encephalopathy. Hx anxiety. P:   Sedation:  Cisatracurium gtt / Fentanyl gtt / Midazolam gtt. RASS goal: -5 during hypothermia protocol. Hold daily WUA while under paralysis. EEG. Neuro consult once rewarmed.  PULMONARY A: VDRF - due to respiratory insufficiency in the setting of cardiac arrest. P:   Full vent support. Wean as able. VAP prevention measures. Hold SBT until off paralytics. Albuterol PRN. CXR in AM.  RENAL A:   Hypokalemia - anticipate worsening during hypothermia protocol. Hypocalcemia. AGMA - lactate. P:   BMP q2hrs x 4.  Replace electrolytes as indicated. NS @ 100. 1g Ca gluconate. BMP in AM.  GASTROINTESTINAL A:   GI prophylaxis. Nutrition. P:  SUP: Pantoprazole. NPO.  HEMATOLOGIC A:  VTE Prophylaxis. P:  SCD's / heparin. Coags q8hrs x 2. CBC in AM.  INFECTIOUS A:   No indication of infection. P:   Monitor clinically.  ENDOCRINE A:   DM  2. P:   ICU hyperglycemia protocol. Assess TSH.   Family updated: Wife updated.  Interdisciplinary Family Meeting v Palliative Care Meeting:  Due by: 03/28.  CC time: 45 minutes.   Montey Hora, [place] [facility] Pager: [phone]  or [phone] 09/27/2015, 8:56 PM

## 2015-10-10 NOTE — Procedures (Signed)
Central Venous Catheter Insertion Procedure Note Clarence Brooks [id number] 31-Jan-1951  Procedure: Insertion of Central Venous Catheter Indications: Assessment of intravascular volume, Drug and/or fluid administration and Frequent blood sampling  Procedure Details Consent: Unable to obtain consent because of altered level of consciousness. Time Out: Verified patient identification, verified procedure, site/side was marked, verified correct patient position, special equipment/implants available, medications/allergies/relevent history reviewed, required imaging and test results available.  Performed  Maximum sterile technique was used including antiseptics, cap, gloves, gown, hand hygiene, mask and sheet. Skin prep: Chlorhexidine; local anesthetic administered A antimicrobial bonded/coated triple lumen catheter was placed in the right internal jugular vein using the Seldinger technique.  Evaluation Blood flow good Complications: No apparent complications Patient did tolerate procedure well. Chest X-ray ordered to verify placement.  CXR: pending.  Procedure performed under direct ultrasound guidance for real time vessel cannulation.      Clarence Brooks, [place] - [facility] [place] [facility] Pager: [phone]  or [phone] 11/27/2015, 10:09 PM

## 2015-10-10 NOTE — ED Provider Notes (Signed)
CSN: [id number]     Arrival date & time Feb 25, 2016  1908 History   First MD Initiated Contact with Patient 0Aug 07, 2017 1926     Chief Complaint  Patient presents with  . Cardiac Arrest     (Consider location/radiation/quality/duration/timing/severity/associated sxs/prior Treatment) Patient is a 65 y.o. male presenting with general illness. The history is provided by the EMS personnel.  Illness Quality:  Cardiac Arrest Severity:  Severe Onset quality:  Sudden Progression:  Resolved Chronicity:  New Context:  Patient had wittnessed Cardiac Arrest. Immediate bystander CPR. 2 rounds of EPI by EMS. Vfib, shocked one with ROSC Relieved by:  Defibrillation Associated symptoms: loss of consciousness     Past Medical History  Diagnosis Date  . Diabetes mellitus without complication (HCC)   . Hypertension   . Dyslipidemia   . Dizziness   . Anxiety   . Osteoarthritis   . Special screening for malignant neoplasm of prostate   . Encounter for long-term (current) use of other medications    Past Surgical History  Procedure Laterality Date  . Knee surgery      bilateral  . Umbilical hernia repair    . Carpal tunnel release      bilateral  . Ankle surgery     Family History  Problem Relation Age of Onset  . Hypertension Mother   . Cancer Father     Throat  . Hypertension Brother    Social History  Substance Use Topics  . Smoking status: Never Smoker   . Smokeless tobacco: None  . Alcohol Use: None    Review of Systems  Unable to perform ROS: Patient unresponsive  Neurological: Positive for loss of consciousness.      Allergies  Ace inhibitors and Lisinopril  Home Medications   Prior to Admission medications   Medication Sig Start Date End Date Taking? Authorizing Provider  AMLODIPINE BESYLATE PO Take 10 mg by mouth daily.    Historical Provider, MD  hydrochlorothiazide (HYDRODIURIL) 25 MG tablet Take 25 mg by mouth daily.    Historical Provider, MD  insulin aspart  (NOVOLOG) 100 UNIT/ML injection Inject 15 Units into the skin 3 (three) times daily with meals.    Historical Provider, MD  Insulin Glargine (LANTUS OPTICLIK) 100 UNIT/ML SOCT Inject 28 Units into the skin daily.    Historical Provider, MD  Multiple Vitamins-Minerals (EQ COMPLETE MULTIVIT ADULT 50+ PO) Take by mouth.    Historical Provider, MD   BP 144/88 mmHg  Pulse 99  Resp 16  SpO2 100% Physical Exam  Constitutional: He appears well-developed and well-nourished.  Unresponsive middle aged african [place] male in EMS gurney  HENT:  Head: Normocephalic and atraumatic.  Right Ear: External ear normal.  Left Ear: External ear normal.  Nose: Nose normal.  King airway in place  Eyes: Conjunctivae are normal. No scleral icterus.  Neck: No tracheal deviation present.  Cardiovascular: Normal rate, regular rhythm and intact distal pulses.   Pulmonary/Chest:  Bilateral breath sounds on bagging  Abdominal: Soft. He exhibits no distension.  Musculoskeletal: He exhibits no edema or tenderness.  Neurological: He is unresponsive. GCS eye subscore is 1. GCS verbal subscore is 1. GCS motor subscore is 1.  Skin: Skin is warm and dry.    ED Course  .Intubation Date/Time: 03/10/16 7:43 PM Performed by: Lula OlszewskiGOEBEL, Nixxon Faria Authorized by: Laurence SpatesLITTLE, RACHEL MORGAN Consent: The procedure was performed in an emergent situation. Patient identity confirmed: arm band and hospital-assigned identification number Indications: respiratory failure and  airway protection  Intubation method: video-assisted Patient status: paralyzed (RSI) Preoxygenation: BVM Sedatives: etomidate Paralytic: rocuronium Laryngoscope size: Mac 3 Tube size: 8.0 mm Tube type: cuffed Number of attempts: 1 Cricoid pressure: no Cords visualized: yes Post-procedure assessment: chest rise and ETCO2 monitor Breath sounds: equal and absent over the epigastrium Cuff inflated: yes ETT to lip: 24 cm Chest x-ray interpreted by me. Chest  x-ray findings: endotracheal tube in appropriate position   (including critical care time) Labs Review Labs Reviewed  COMPREHENSIVE METABOLIC PANEL - Abnormal; Notable for the following:    Potassium 3.3 (*)    CO2 18 (*)    Glucose, Bld 398 (*)    Creatinine, Ser 1.38 (*)    Calcium 8.8 (*)    Albumin 3.4 (*)    AST 150 (*)    ALT 166 (*)    GFR calc non Af Amer 53 (*)    Anion gap 17 (*)    All other components within normal limits  CBC WITH DIFFERENTIAL/PLATELET - Abnormal; Notable for the following:    Lymphs Abs 4.6 (*)    All other components within normal limits  I-STAT CHEM 8, ED - Abnormal; Notable for the following:    Potassium 3.2 (*)    Glucose, Bld 380 (*)    Calcium, Ion 1.11 (*)    All other components within normal limits  I-STAT CG4 LACTIC ACID, ED - Abnormal; Notable for the following:    Lactic Acid, Venous 7.67 (*)    All other components within normal limits  BLOOD GAS, ARTERIAL    Imaging Review Dg Chest Port 1 View  2015-11-05  CLINICAL DATA:  Found unresponsive. Status post CPR. Status post intubation. Acute respiratory failure. EXAM: PORTABLE CHEST 1 VIEW COMPARISON:  09/26/2010 FINDINGS: Technically suboptimal exam due to patient positioning. Endotracheal tube is seen with tip approximately 4 cm above the carina. Heart size remains within normal limits allowing for positioning. No evidence of pulmonary consolidation to or pleural effusion. Although the lung apices are not visualized, no definite pneumothorax seen. IMPRESSION: Technically suboptimal exam. Endotracheal tube in appropriate position. No other acute findings. Electronically Signed   By: Myles Rosenthal M.D.   On: 05-Nov-2015 19:45   I have personally reviewed and evaluated these images and lab results as part of my medical decision-making.   EKG Interpretation None      MDM   Final diagnoses:  Cardiac arrest with ventricular fibrillation (HCC)  ST elevation myocardial infarction (STEMI),  unspecified artery [facility])    The patient is a 65 year old male with a history of hypertension and diabetes who presents after suffering a cardiac arrest. The patient had a witnessed cardiac arrest with bystander CPR. 2 rounds of epinephrine given by EMS with initial rhythm of ventricular fibrillation. ROSC after defibrillation. EKG shows STEMI. On arrival patient is spontaneous circulation but is unresponsive with a GCS of 3. Patient intubated as an procedure section above. Aspirin suppository given. Hypothermic protocol initiated. Patient taken to Cath Lab for further management. Critical care consultation for further management of patient.  Patient seen with attending, Dr. Clarene Duke, who oversaw clinical decision making.   Lula Olszewski, MD 11/05/2015 2025  Laurence Spates, MD 10/13/15 [phone]

## 2015-10-10 NOTE — Progress Notes (Signed)
ANTICOAGULATION CONSULT NOTE - Initial Consult  Pharmacy Consult for Heparin  Indication: IABP  Allergies  Allergen Reactions  . Ace Inhibitors Swelling  . Lisinopril     Throat swelling     Patient Measurements: Weight: 240 lb 4.8 oz (109 kg)   Vital Signs: BP: 144/88 mmHg (03/22 1915) Pulse Rate: 99 (03/22 1915)  Labs:  Recent Labs  02-01-2016 1928 02-01-2016 1936  HGB 13.1 15.3  HCT 41.1 45.0  PLT 192  --   CREATININE 1.38* 1.00    CrCl cannot be calculated (Unknown ideal weight.).   Medical History: Past Medical History  Diagnosis Date  . Diabetes mellitus without complication (HCC)   . Hypertension   . Dyslipidemia   . Dizziness   . Anxiety   . Osteoarthritis   . Special screening for malignant neoplasm of prostate   . Encounter for long-term (current) use of other medications      Assessment: [age] yom admitted May 02, 2016  with STEMI. S/p cardiac arrest. Pt received CPR and 2 epis w/ ROSC. Code cool called   S/P PCI and IABP inserted on 3/22. Pharmacy consulted to dose heparin for ACS/IABP. May end up needing CABG per Dr. Tresa EndoKelly.    Goal of Therapy:  HL 0.2-0.5 Monitor platelets by anticoagulation protocol: Yes   Plan:  Heparin 900 units/hr (5,000 unit bolus in cath lab) 4hr HL Daily HL/CBC Monitor for s/s of bleeding F/U further cardiology plans

## 2015-10-10 NOTE — ED Notes (Signed)
Pt. arrived with EMS reports  found unresponsive by by-stander received 2 epinephrine and shocked once prior to arrival , tachycardic at arrival with king airway and intraosseous at left lower leg .

## 2015-10-10 NOTE — H&P (Signed)
Patient ID: Pattrick Bady MRN: [id number], DOB/AGE: 64/24/1952   Admit date: 10-16-2015  Consulting Physician: [facility] ER. Primary Physician: Cain Saupe, MD Primary Cardiologist: Dr. Antoine Poche  Reason for admission: VF arrest, lateral STEMI  Pt. Profile:  Tilford Deaton is a 65 y.o. male with a history of HTN, HLD, DMT2 and obesity who was brought to [facility] ED via EMS after a witnessed out of hospital VF arrest and anterolateral STEMI.   He was seen by Dr. Antoine Poche in 2014 for evaluation of an abnormal ECG (TWIs) and multiple RFs for CAD. He ordered a stress echo but i don't see that it was ever done and he was lost to follow up.  Today he had a witnessed cardiac arrest. CPR was immediately initiated. EMS was called and he was found unresponsive. He was found to be in VF and was given 2 rounds of epi and shocked x1 with ROSC. ECG with anterolateral ST elevation. Patient remained unresponsive and king airway and left LLE IO access was obtained. He was brought to [facility] where he was intubated in the ER. He was given rectal ASA and brought to the cath lab for emergent cardiac catheterization.  Problem List  Past Medical History  Diagnosis Date  . Diabetes mellitus without complication (HCC)   . Hypertension   . Dyslipidemia   . Dizziness   . Anxiety   . Osteoarthritis   . Special screening for malignant neoplasm of prostate   . Encounter for long-term (current) use of other medications     Past Surgical History  Procedure Laterality Date  . Knee surgery      bilateral  . Umbilical hernia repair    . Carpal tunnel release      bilateral  . Ankle surgery       Allergies  Allergies  Allergen Reactions  . Ace Inhibitors Swelling  . Lisinopril     Throat swelling      Home Medications  Prior to Admission medications   Medication Sig Start Date End Date Taking? Authorizing Provider  AMLODIPINE BESYLATE PO Take 10 mg by mouth daily.    Historical Provider, MD    hydrochlorothiazide (HYDRODIURIL) 25 MG tablet Take 25 mg by mouth daily.    Historical Provider, MD  insulin aspart (NOVOLOG) 100 UNIT/ML injection Inject 15 Units into the skin 3 (three) times daily with meals.    Historical Provider, MD  Insulin Glargine (LANTUS OPTICLIK) 100 UNIT/ML SOCT Inject 28 Units into the skin daily.    Historical Provider, MD  Multiple Vitamins-Minerals (EQ COMPLETE MULTIVIT ADULT 50+ PO) Take by mouth.    Historical Provider, MD    Family History  Family History  Problem Relation Age of Onset  . Hypertension Mother   . Cancer Father     Throat  . Hypertension Brother    Family Status  Relation Status Death Age  . Mother Alive   . Father Deceased   . Brother Alive      Social History  Social History   Social History  . Marital Status: Married    Spouse Name: N/A  . Number of Children: 4  . Years of Education: N/A   Occupational History  . DISABLED    Social History Main Topics  . Smoking status: Never Smoker   . Smokeless tobacco: Not on file  . Alcohol Use: Not on file  . Drug Use: Not on file  . Sexual Activity: Not on file   Other Topics  Concern  . Not on file   Social History Narrative   Lives with wife.       All other systems reviewed and are otherwise negative except as noted above.  Physical Exam  There were no vitals taken for this visit.  General: Pleasant, NAD Psych: Normal affect. Neuro: Alert and oriented X 3. Moves all extremities spontaneously. HEENT: Normal  Neck: Supple without bruits or JVD. Lungs:  Resp regular and unlabored, CTA. Heart: RRR no s3, s4, or murmurs. Abdomen: Soft, non-tender, non-distended, BS + x 4.  Extremities: No clubbing, cyanosis or edema. DP/PT/Radials 2+ and equal bilaterally.  Labs  No results for input(s): CKTOTAL, CKMB, TROPONINI in the last 72 hours. Lab Results  Component Value Date   WBC 7.4 09/28/2010   HGB 13.0 09/28/2010   HCT 38.6* 09/28/2010   MCV 91.5  09/28/2010   PLT 193 09/28/2010   No results for input(s): NA, K, CL, CO2, BUN, CREATININE, CALCIUM, PROT, BILITOT, ALKPHOS, ALT, AST, GLUCOSE in the last 168 hours.  Invalid input(s): LABALBU  No results found for: DDIMER   Radiology/Studies  No results found.  ECG  HR 87. NSR with ST elevation in I, AVL and V3-V6  ASSESSMENT AND PLAN  Fredirick MaudlinWilson Warf is a 65 y.o. male with a history of HTN, HLD, DMT2 and obesity who [facility] after a witnessed out of hospital VF arrest and anterolateral STEMI.   Ventricular fibrillation/ anterolateral STEMI: witnessed cardiac arrest. CPR was immediately initiated. EMS was called and he was found unresponsive. He was found to be in VF and was given 2 rounds of epi and shocked x1 with ROSC. ECG with anterolateral ST elevation. Patient remained unresponsive and king airway and left LLE IO access was obtained. [facility] in [facility] ER. He was given rectal ASA and brought to the cath lab for emergent cardiac catheterization.  PLAN: emergent cardiac catheterization with possible PCI. We will need to consult PCCM for vent management and hypothermia protocol     Signed, Janetta HoraHOMPSON, KATHRYN R, PA-C 10-25-2015, 7:21 PM  Pager [phone]  Patient seen and examined. Agree with assessment and plan.  Mr. Fredirick MaudlinWilson Rieser is a 65 year old African-American male who has a history of hypertension, hyperlipidemia, type 2 diabetes mellitus and obesity.  He suffered an out of hospital cardiac arrest that was witnessed, CPR was instituted and he underwent successful defibrillation out of ventricular fibrillation.  [facility] hiis ECG showed anterolateral ST elevation and a code STEMI was activated.  He was intubated in the emergency room and is brought to the catheterization laboratory for emergent cardiac catheterization and possible intervention.   Lennette Biharihomas A. Caylan Chenard, MD, [facility] 10-25-2015 8:50 PM

## 2015-10-10 NOTE — Progress Notes (Signed)
Chaplain responded to trauma page for patient in Trauma B.   Family had been escorted to the consultation room and was waiting there anxiouslly (mom, daughter, son).  The Charge RN told me that the patient was going to the Cath Lab, so I should take the family there.  I escorted the family to the waiting room outside the cath lab, then alerted the cath lab team where they were located.  Rev. Cottonwood HeightsJan Hill, [place]Chaplain [phone]

## 2015-10-11 ENCOUNTER — Other Ambulatory Visit (HOSPITAL_COMMUNITY): Payer: Self-pay

## 2015-10-11 ENCOUNTER — Inpatient Hospital Stay (HOSPITAL_COMMUNITY): Payer: Medicare Other

## 2015-10-11 ENCOUNTER — Encounter (HOSPITAL_COMMUNITY): Payer: Self-pay | Admitting: Cardiovascular Disease

## 2015-10-11 DIAGNOSIS — I469 Cardiac arrest, cause unspecified: Secondary | ICD-10-CM

## 2015-10-11 DIAGNOSIS — J9620 Acute and chronic respiratory failure, unspecified whether with hypoxia or hypercapnia: Secondary | ICD-10-CM | POA: Insufficient documentation

## 2015-10-11 DIAGNOSIS — J9621 Acute and chronic respiratory failure with hypoxia: Secondary | ICD-10-CM

## 2015-10-11 DIAGNOSIS — R569 Unspecified convulsions: Secondary | ICD-10-CM

## 2015-10-11 DIAGNOSIS — E876 Hypokalemia: Secondary | ICD-10-CM

## 2015-10-11 LAB — POCT I-STAT, CHEM 8
BUN: 16 mg/dL (ref 6–20)
BUN: 17 mg/dL (ref 6–20)
BUN: 15 mg/dL (ref 6–20)
CALCIUM ION: 1.18 mmol/L (ref 1.13–1.30)
CHLORIDE: 108 mmol/L (ref 101–111)
CHLORIDE: 109 mmol/L (ref 101–111)
CREATININE: 0.8 mg/dL (ref 0.61–1.24)
CREATININE: 0.9 mg/dL (ref 0.61–1.24)
CREATININE: 0.9 mg/dL (ref 0.61–1.24)
Calcium, Ion: 1.11 mmol/L — ABNORMAL LOW (ref 1.13–1.30)
Calcium, Ion: 1.15 mmol/L (ref 1.13–1.30)
Chloride: 108 mmol/L (ref 101–111)
GLUCOSE: 261 mg/dL — AB (ref 65–99)
GLUCOSE: 368 mg/dL — AB (ref 65–99)
Glucose, Bld: 343 mg/dL — ABNORMAL HIGH (ref 65–99)
HCT: 37 % — ABNORMAL LOW (ref 39.0–52.0)
HCT: 40 % (ref 39.0–52.0)
HEMATOCRIT: 38 % — AB (ref 39.0–52.0)
HEMOGLOBIN: 12.6 g/dL — AB (ref 13.0–17.0)
HEMOGLOBIN: 12.9 g/dL — AB (ref 13.0–17.0)
Hemoglobin: 13.6 g/dL (ref 13.0–17.0)
POTASSIUM: 3.8 mmol/L (ref 3.5–5.1)
POTASSIUM: 4.7 mmol/L (ref 3.5–5.1)
Potassium: 3.1 mmol/L — ABNORMAL LOW (ref 3.5–5.1)
SODIUM: 139 mmol/L (ref 135–145)
Sodium: 138 mmol/L (ref 135–145)
Sodium: 142 mmol/L (ref 135–145)
TCO2: 16 mmol/L (ref 0–100)
TCO2: 17 mmol/L (ref 0–100)
TCO2: 18 mmol/L (ref 0–100)

## 2015-10-11 LAB — BASIC METABOLIC PANEL
ANION GAP: 10 (ref 5–15)
Anion gap: 10 (ref 5–15)
Anion gap: 12 (ref 5–15)
Anion gap: 9 (ref 5–15)
BUN: 13 mg/dL (ref 6–20)
BUN: 13 mg/dL (ref 6–20)
BUN: 12 mg/dL (ref 6–20)
BUN: 8 mg/dL (ref 6–20)
CALCIUM: 8.4 mg/dL — AB (ref 8.9–10.3)
CHLORIDE: 110 mmol/L (ref 101–111)
CHLORIDE: 110 mmol/L (ref 101–111)
CHLORIDE: 112 mmol/L — AB (ref 101–111)
CO2: 18 mmol/L — ABNORMAL LOW (ref 22–32)
CO2: 19 mmol/L — AB (ref 22–32)
CO2: 19 mmol/L — AB (ref 22–32)
CO2: 18 mmol/L — ABNORMAL LOW (ref 22–32)
CREATININE: 1.01 mg/dL (ref 0.61–1.24)
CREATININE: 0.76 mg/dL (ref 0.61–1.24)
Calcium: 8.6 mg/dL — ABNORMAL LOW (ref 8.9–10.3)
Calcium: 8.6 mg/dL — ABNORMAL LOW (ref 8.9–10.3)
Calcium: 8.4 mg/dL — ABNORMAL LOW (ref 8.9–10.3)
Chloride: 113 mmol/L — ABNORMAL HIGH (ref 101–111)
Creatinine, Ser: 1.01 mg/dL (ref 0.61–1.24)
Creatinine, Ser: 0.87 mg/dL (ref 0.61–1.24)
GFR calc Af Amer: >60 mL/min (ref >60)
GFR calc non Af Amer: >60 mL/min (ref >60)
GFR calc non Af Amer: >60 mL/min (ref >60)
GFR calc non Af Amer: >60 mL/min (ref >60)
GLUCOSE: 262 mg/dL — AB (ref 65–99)
Glucose, Bld: 258 mg/dL — ABNORMAL HIGH (ref 65–99)
Glucose, Bld: 179 mg/dL — ABNORMAL HIGH (ref 65–99)
Glucose, Bld: 156 mg/dL — ABNORMAL HIGH (ref 65–99)
POTASSIUM: 3 mmol/L — AB (ref 3.5–5.1)
POTASSIUM: 3.1 mmol/L — AB (ref 3.5–5.1)
POTASSIUM: 2.8 mmol/L — AB (ref 3.5–5.1)
Potassium: 3.9 mmol/L (ref 3.5–5.1)
SODIUM: 141 mmol/L (ref 135–145)
SODIUM: 141 mmol/L (ref 135–145)
SODIUM: 140 mmol/L (ref 135–145)
Sodium: 138 mmol/L (ref 135–145)

## 2015-10-11 LAB — GLUCOSE, CAPILLARY
GLUCOSE-CAPILLARY: 114 mg/dL — AB (ref 65–99)
GLUCOSE-CAPILLARY: 143 mg/dL — AB (ref 65–99)
GLUCOSE-CAPILLARY: 155 mg/dL — AB (ref 65–99)
GLUCOSE-CAPILLARY: 180 mg/dL — AB (ref 65–99)
GLUCOSE-CAPILLARY: 181 mg/dL — AB (ref 65–99)
GLUCOSE-CAPILLARY: 184 mg/dL — AB (ref 65–99)
GLUCOSE-CAPILLARY: 246 mg/dL — AB (ref 65–99)
GLUCOSE-CAPILLARY: 291 mg/dL — AB (ref 65–99)
GLUCOSE-CAPILLARY: 325 mg/dL — AB (ref 65–99)
GLUCOSE-CAPILLARY: 340 mg/dL — AB (ref 65–99)
Glucose-Capillary: 323 mg/dL — ABNORMAL HIGH (ref 65–99)
Glucose-Capillary: 202 mg/dL — ABNORMAL HIGH (ref 65–99)
Glucose-Capillary: 131 mg/dL — ABNORMAL HIGH (ref 65–99)
Glucose-Capillary: 131 mg/dL — ABNORMAL HIGH (ref 65–99)
Glucose-Capillary: 139 mg/dL — ABNORMAL HIGH (ref 65–99)
Glucose-Capillary: 175 mg/dL — ABNORMAL HIGH (ref 65–99)
Glucose-Capillary: 179 mg/dL — ABNORMAL HIGH (ref 65–99)
Glucose-Capillary: 127 mg/dL — ABNORMAL HIGH (ref 65–99)

## 2015-10-11 LAB — CBC
HCT: 38.4 % — ABNORMAL LOW (ref 39.0–52.0)
Hemoglobin: 12.6 g/dL — ABNORMAL LOW (ref 13.0–17.0)
MCH: 29.7 pg (ref 26.0–34.0)
MCHC: 32.8 g/dL (ref 30.0–36.0)
MCV: 90.6 fL (ref 78.0–100.0)
PLATELETS: 173 10*3/uL (ref 150–400)
RBC: 4.24 MIL/uL (ref 4.22–5.81)
RDW: 12.7 % (ref 11.5–15.5)
WBC: 7.8 10*3/uL (ref 4.0–10.5)

## 2015-10-11 LAB — POCT I-STAT 3, ART BLOOD GAS (G3+)
ACID-BASE DEFICIT: 8 mmol/L — AB (ref 0.0–2.0)
BICARBONATE: 20 meq/L (ref 20.0–24.0)
O2 Saturation: 100 %
PH ART: 7.195 — AB (ref 7.350–7.450)
PO2 ART: 303 mmHg — AB (ref 80.0–100.0)
TCO2: 22 mmol/L (ref 0–100)
pCO2 arterial: 51.7 mmHg — ABNORMAL HIGH (ref 35.0–45.0)

## 2015-10-11 LAB — BLOOD GAS, ARTERIAL
Acid-base deficit: 5.8 mmol/L — ABNORMAL HIGH (ref 0.0–2.0)
BICARBONATE: 17.3 meq/L — AB (ref 20.0–24.0)
Drawn by: 449561
FIO2: 0.5
LHR: 22 {breaths}/min
O2 SAT: 99.3 %
PEEP/CPAP: 5 cmH2O
PO2 ART: 155 mmHg — AB (ref 80.0–100.0)
Patient temperature: 98.6
TCO2: 18 mmol/L (ref 0–100)
VT: 620 mL
pCO2 arterial: 24.2 mmHg — ABNORMAL LOW (ref 35.0–45.0)
pH, Arterial: 7.468 — ABNORMAL HIGH (ref 7.350–7.450)

## 2015-10-11 LAB — PROTIME-INR
INR: 1.18 (ref 0.00–1.49)
PROTHROMBIN TIME: 15.1 s (ref 11.6–15.2)

## 2015-10-11 LAB — ECHOCARDIOGRAM COMPLETE
HEIGHTINCHES: 72 in
Weight: 3287.5 oz

## 2015-10-11 LAB — TSH: TSH: 0.621 u[IU]/mL (ref 0.350–4.500)

## 2015-10-11 LAB — MAGNESIUM
MAGNESIUM: 1.9 mg/dL (ref 1.7–2.4)
Magnesium: 2.1 mg/dL (ref 1.7–2.4)

## 2015-10-11 LAB — LACTIC ACID, PLASMA: LACTIC ACID, VENOUS: 2.5 mmol/L — AB (ref 0.5–2.0)

## 2015-10-11 LAB — HEPARIN LEVEL (UNFRACTIONATED)
HEPARIN UNFRACTIONATED: 0.37 [IU]/mL (ref 0.30–0.70)
Heparin Unfractionated: 0.39 IU/mL (ref 0.30–0.70)

## 2015-10-11 LAB — MRSA PCR SCREENING: MRSA BY PCR: NEGATIVE

## 2015-10-11 LAB — PHOSPHORUS: Phosphorus: 1.6 mg/dL — ABNORMAL LOW (ref 2.5–4.6)

## 2015-10-11 LAB — APTT: APTT: 62 s — AB (ref 24–37)

## 2015-10-11 LAB — TROPONIN I
TROPONIN I: 0.38 ng/mL — AB (ref <0.031)
Troponin I: 0.29 ng/mL — ABNORMAL HIGH (ref <0.031)
Troponin I: 0.34 ng/mL — ABNORMAL HIGH (ref <0.031)

## 2015-10-11 MED ORDER — SODIUM CHLORIDE 0.9 % IV SOLN
INTRAVENOUS | Status: DC
  Administered 2015-10-11: 2.8 [IU]/h via INTRAVENOUS
  Filled 2015-10-11: qty 2.5

## 2015-10-11 MED ORDER — SODIUM CHLORIDE 0.9% FLUSH
10.0000 mL | Freq: Two times a day (BID) | INTRAVENOUS | Status: DC
  Administered 2015-10-11 – 2015-10-17 (×7): 10 mL

## 2015-10-11 MED ORDER — ANTISEPTIC ORAL RINSE SOLUTION (CORINZ)
7.0000 mL | OROMUCOSAL | Status: DC
  Administered 2015-10-11 – 2015-10-18 (×74): 7 mL via OROMUCOSAL

## 2015-10-11 MED ORDER — CHLORHEXIDINE GLUCONATE 0.12% ORAL RINSE (MEDLINE KIT)
15.0000 mL | Freq: Two times a day (BID) | OROMUCOSAL | Status: DC
  Administered 2015-10-11 – 2015-10-18 (×16): 15 mL via OROMUCOSAL

## 2015-10-11 MED ORDER — SODIUM CHLORIDE 0.9 % IV SOLN
1.0000 mg/h | INTRAVENOUS | Status: DC
  Administered 2015-10-11: 8 mg/h via INTRAVENOUS
  Filled 2015-10-11 (×2): qty 20

## 2015-10-11 MED ORDER — SODIUM CHLORIDE 0.9 % IV SOLN
1000.0000 mg | Freq: Two times a day (BID) | INTRAVENOUS | Status: DC
  Administered 2015-10-11: 1000 mg via INTRAVENOUS
  Filled 2015-10-11 (×2): qty 10

## 2015-10-11 MED ORDER — INSULIN GLARGINE 100 UNIT/ML ~~LOC~~ SOLN
10.0000 [IU] | SUBCUTANEOUS | Status: DC
  Administered 2015-10-11 – 2015-10-17 (×7): 10 [IU] via SUBCUTANEOUS
  Filled 2015-10-11 (×8): qty 0.1

## 2015-10-11 MED ORDER — SODIUM CHLORIDE 0.9 % IV SOLN
45.0000 mg/h | INTRAVENOUS | Status: DC
  Administered 2015-10-12 (×5): 20 mg/h via INTRAVENOUS
  Administered 2015-10-13: 30 mg/h via INTRAVENOUS
  Administered 2015-10-13: 45 mg/h via INTRAVENOUS
  Administered 2015-10-13 (×2): 25 mg/h via INTRAVENOUS
  Filled 2015-10-11 (×8): qty 20

## 2015-10-11 MED ORDER — ANTISEPTIC ORAL RINSE SOLUTION (CORINZ)
7.0000 mL | Freq: Four times a day (QID) | OROMUCOSAL | Status: DC
  Administered 2015-10-11: 7 mL via OROMUCOSAL

## 2015-10-11 MED ORDER — MIDAZOLAM BOLUS VIA INFUSION
16.0000 mg | Freq: Once | INTRAVENOUS | Status: AC
  Administered 2015-10-11: 16 mg via INTRAVENOUS
  Filled 2015-10-11: qty 16

## 2015-10-11 MED ORDER — DEXTROSE 10 % IV SOLN: INTRAVENOUS | Status: DC | PRN

## 2015-10-11 MED ORDER — SODIUM CHLORIDE 0.9 % IV SOLN
1000.0000 mg | INTRAVENOUS | Status: AC
  Administered 2015-10-11: 1000 mg via INTRAVENOUS
  Filled 2015-10-11: qty 10

## 2015-10-11 MED ORDER — POTASSIUM PHOSPHATES 15 MMOLE/5ML IV SOLN
20.0000 mmol | Freq: Once | INTRAVENOUS | Status: AC
  Administered 2015-10-11: 20 mmol via INTRAVENOUS
  Filled 2015-10-11: qty 6.67

## 2015-10-11 MED ORDER — POTASSIUM CHLORIDE 10 MEQ/100ML IV SOLN
10.0000 meq | INTRAVENOUS | Status: DC
  Administered 2015-10-11 (×4): 10 meq via INTRAVENOUS
  Filled 2015-10-11 (×6): qty 100

## 2015-10-11 MED ORDER — CHLORHEXIDINE GLUCONATE 0.12% ORAL RINSE (MEDLINE KIT)
15.0000 mL | Freq: Two times a day (BID) | OROMUCOSAL | Status: DC
  Administered 2015-10-11: 15 mL via OROMUCOSAL

## 2015-10-11 MED ORDER — POTASSIUM CHLORIDE 10 MEQ/50ML IV SOLN
10.0000 meq | INTRAVENOUS | Status: AC
  Administered 2015-10-11 (×3): 10 meq via INTRAVENOUS
  Filled 2015-10-11 (×3): qty 50

## 2015-10-11 MED ORDER — POTASSIUM CHLORIDE 10 MEQ/100ML IV SOLN
10.0000 meq | INTRAVENOUS | Status: AC
  Administered 2015-10-11 (×4): 10 meq via INTRAVENOUS
  Filled 2015-10-11 (×2): qty 100

## 2015-10-11 MED ORDER — SODIUM CHLORIDE 0.9% FLUSH: 10.0000 mL | INTRAVENOUS | Status: DC | PRN

## 2015-10-11 MED ORDER — MAGNESIUM SULFATE IN D5W 10-5 MG/ML-% IV SOLN
1.0000 g | Freq: Once | INTRAVENOUS | Status: AC
  Administered 2015-10-11: 1 g via INTRAVENOUS
  Filled 2015-10-11: qty 100

## 2015-10-11 MED ORDER — POTASSIUM CHLORIDE 20 MEQ/15ML (10%) PO SOLN: 40.0000 meq | Freq: Once | ORAL | Status: DC

## 2015-10-11 MED ORDER — INSULIN ASPART 100 UNIT/ML ~~LOC~~ SOLN
2.0000 [IU] | SUBCUTANEOUS | Status: DC
  Administered 2015-10-11 – 2015-10-12 (×2): 2 [IU] via SUBCUTANEOUS
  Administered 2015-10-12: 4 [IU] via SUBCUTANEOUS
  Administered 2015-10-12 – 2015-10-15 (×7): 2 [IU] via SUBCUTANEOUS
  Administered 2015-10-15: 4 [IU] via SUBCUTANEOUS
  Administered 2015-10-15: 2 [IU] via SUBCUTANEOUS
  Administered 2015-10-16 (×6): 6 [IU] via SUBCUTANEOUS
  Administered 2015-10-16: 4 [IU] via SUBCUTANEOUS
  Administered 2015-10-17: 2 [IU] via SUBCUTANEOUS
  Administered 2015-10-17: 6 [IU] via SUBCUTANEOUS
  Administered 2015-10-17 – 2015-10-18 (×2): 2 [IU] via SUBCUTANEOUS
  Administered 2015-10-18: 4 [IU] via SUBCUTANEOUS
  Administered 2015-10-18 (×3): 2 [IU] via SUBCUTANEOUS
  Administered 2015-10-18: 4 [IU] via SUBCUTANEOUS

## 2015-10-11 NOTE — Progress Notes (Signed)
Routine EEG stopped, LTM started

## 2015-10-11 NOTE — Progress Notes (Signed)
ANTICOAGULATION CONSULT NOTE - Follow-up Consult  Pharmacy Consult for Heparin  Indication: IABP  Allergies  Allergen Reactions  . Ace Inhibitors Swelling  . Lisinopril     Throat swelling     Patient Measurements: Height: 6' (182.9 cm) Weight: 205 lb 7.5 oz (93.2 kg) IBW/kg (Calculated) : 77.6   Vital Signs: Temp: 91.8 F (33.2 C) (03/23 1400) Temp Source: Core (Comment) (03/23 1400) BP: 112/70 mmHg (03/23 1400) Pulse Rate: 52 (03/23 1400)  Labs:  Recent Labs  01-27-16 1928  01-27-16 2200  10/11/15 0227 10/11/15 0400 10/11/15 0420 10/11/15 1105  HGB 13.1  < >  --   < > 12.9* 12.6* 13.6  --   HCT 41.1  < >  --   < > 38.0* 38.4* 40.0  --   PLT 192  --   --   --   --  173  --   --   APTT  --   --  58*  --   --  62*  --   --   LABPROT  --   --  15.4*  --   --  15.1  --   --   INR  --   --  1.20  --   --  1.18  --   --   HEPARINUNFRC  --   --   --   --   --  0.37  --  0.39  CREATININE 1.38*  < > 1.19  < > 0.80 1.01  1.01 0.90 0.87  TROPONINI  --   --  0.07*  --   --  0.29*  --  0.34*  < > = values in this interval not displayed.  Estimated Creatinine Clearance: 103 mL/min (by C-G formula based on Cr of 0.87).   Assessment: [age] yom admitted 07/19/16 with STEMI. S/p cardiac arrest. Pt received CPR and 2 episodes w/ ROSC. Started on hypothermic protocol. S/p PCI and IABP inserted 3/22. Pharmacy consulted to dose heparin for ACS/IABP. Heparin level therapeutic on 900 units/hr. CBC stable. No bleeding noted.  Goal of Therapy:  HL 0.2-0.5 Monitor platelets by anticoagulation protocol: Yes   Plan:  Continue heparin 900 units/hr F/u heparin level when rewarming begins. Daily heparin level and CBC.  Tad MooreJessica Lular Letson, Pharm D, BCPS  Clinical Pharmacist Pager [phone]  10/11/2015 2:47 PM

## 2015-10-11 NOTE — Progress Notes (Signed)
[facility] value K+ 2.8 and ectopy including short VT run at 1343.  Will notify eMD Dr Jamison NeighborNestor once he arrives if the rounding MD does not respond.

## 2015-10-11 NOTE — Progress Notes (Signed)
Subjective: Intubated  Sedated   Objective: Filed Vitals:   10/11/15 0530 10/11/15 0600 10/11/15 0700 10/11/15 0715  BP:  121/73    Pulse: 64 61 57 58  Temp:  91.8 F (33.2 C) 91.4 F (33 C)   Resp: 0 0 22 22  Height:      Weight:      SpO2: 99% 100% 100% 100%   Weight change:   Intake/Output Summary (Last 24 hours) at 10/11/15 1610 Last data filed at 10/11/15 0700  Gross per 24 hour  Intake 3582.9 ml  Output      0 ml  Net 3582.9 ml    General: INtubated  Sedated   Neck:  Unable to assess JVP Heart: Regular rate and rhythm, without murmurs    Lungs: Clear to auscultation.  No rales or wheezes. Exemities:  Tr edema.     Tele  SR  Lab Results: Results for orders placed or performed during the hospital encounter of 10/29/2015 (from the past 24 hour(s))  Comprehensive metabolic panel     Status: Abnormal   Collection Time: 2015-10-29  7:28 PM  Result Value Ref Range   Sodium 139 135 - 145 mmol/L   Potassium 3.3 (L) 3.5 - 5.1 mmol/L   Chloride 104 101 - 111 mmol/L   CO2 18 (L) 22 - 32 mmol/L   Glucose, Bld 398 (H) 65 - 99 mg/dL   BUN 16 6 - 20 mg/dL   Creatinine, Ser 9.60 (H) 0.61 - 1.24 mg/dL   Calcium 8.8 (L) 8.9 - 10.3 mg/dL   Total Protein 7.2 6.5 - 8.1 g/dL   Albumin 3.4 (L) 3.5 - 5.0 g/dL   AST 454 (H) 15 - 41 U/L   ALT 166 (H) 17 - 63 U/L   Alkaline Phosphatase 72 38 - 126 U/L   Total Bilirubin 0.8 0.3 - 1.2 mg/dL   GFR calc non Af Amer 53 (L) >60 mL/min   GFR calc Af Amer >60 >60 mL/min   Anion gap 17 (H) 5 - 15  CBC with Differential     Status: Abnormal   Collection Time: 10/29/15  7:28 PM  Result Value Ref Range   WBC 8.9 4.0 - 10.5 K/uL   RBC 4.36 4.22 - 5.81 MIL/uL   Hemoglobin 13.1 13.0 - 17.0 g/dL   HCT 09.8 11.9 - 14.7 %   MCV 94.3 78.0 - 100.0 fL   MCH 30.0 26.0 - 34.0 pg   MCHC 31.9 30.0 - 36.0 g/dL   RDW 82.9 56.2 - 13.0 %   Platelets 192 150 - 400 K/uL   Neutrophils Relative % 43 %   Neutro Abs 3.9 1.7 - 7.7 K/uL   Lymphocytes  Relative 51 %   Lymphs Abs 4.6 (H) 0.7 - 4.0 K/uL   Monocytes Relative 5 %   Monocytes Absolute 0.4 0.1 - 1.0 K/uL   Eosinophils Relative 1 %   Eosinophils Absolute 0.1 0.0 - 0.7 K/uL   Basophils Relative 0 %   Basophils Absolute 0.0 0.0 - 0.1 K/uL  I-stat Chem 8, ED     Status: Abnormal   Collection Time: 10-29-15  7:36 PM  Result Value Ref Range   Sodium 139 135 - 145 mmol/L   Potassium 3.2 (L) 3.5 - 5.1 mmol/L   Chloride 103 101 - 111 mmol/L   BUN 19 6 - 20 mg/dL   Creatinine, Ser 8.65 0.61 - 1.24 mg/dL   Glucose, Bld 784 (H) 65 - 99  mg/dL   Calcium, Ion 1.61 (L) 1.13 - 1.30 mmol/L   TCO2 19 0 - 100 mmol/L   Hemoglobin 15.3 13.0 - 17.0 g/dL   HCT 09.6 04.5 - 40.9 %  I-Stat CG4 Lactic Acid, ED     Status: Abnormal   Collection Time: 10-21-2015  7:36 PM  Result Value Ref Range   Lactic Acid, Venous 7.67 (HH) 0.5 - 2.0 mmol/L   Comment NOTIFIED PHYSICIAN   Troponin I     Status: Abnormal   Collection Time: 21-Oct-2015 10:00 PM  Result Value Ref Range   Troponin I 0.07 (H) <0.031 ng/mL  Basic metabolic panel     Status: Abnormal   Collection Time: 2015-10-21 10:00 PM  Result Value Ref Range   Sodium 136 135 - 145 mmol/L   Potassium 4.6 3.5 - 5.1 mmol/L   Chloride 107 101 - 111 mmol/L   CO2 16 (L) 22 - 32 mmol/L   Glucose, Bld 336 (H) 65 - 99 mg/dL   BUN 15 6 - 20 mg/dL   Creatinine, Ser 8.11 0.61 - 1.24 mg/dL   Calcium 8.0 (L) 8.9 - 10.3 mg/dL   GFR calc non Af Amer >60 >60 mL/min   GFR calc Af Amer >60 >60 mL/min   Anion gap 13 5 - 15  Protime-INR now and repeat in 8 hours     Status: Abnormal   Collection Time: 10/21/15 10:00 PM  Result Value Ref Range   Prothrombin Time 15.4 (H) 11.6 - 15.2 seconds   INR 1.20 0.00 - 1.49  APTT now and repeat in 8 hours     Status: Abnormal   Collection Time: 10/21/15 10:00 PM  Result Value Ref Range   aPTT 58 (H) 24 - 37 seconds  I-STAT 3, arterial blood gas (G3+)     Status: Abnormal   Collection Time: 21-Oct-2015 10:05 PM  Result  Value Ref Range   pH, Arterial 7.394 7.350 - 7.450   pCO2 arterial 32.7 (L) 35.0 - 45.0 mmHg   pO2, Arterial 401.0 (H) 80.0 - 100.0 mmHg   Bicarbonate 20.2 20.0 - 24.0 mEq/L   TCO2 21 0 - 100 mmol/L   O2 Saturation 100.0 %   Acid-base deficit 4.0 (H) 0.0 - 2.0 mmol/L   Patient temperature 97.1 F    Collection site ARTERIAL LINE    Drawn by Operator    Sample type ARTERIAL   Glucose, capillary     Status: Abnormal   Collection Time: 10/21/2015 10:07 PM  Result Value Ref Range   Glucose-Capillary 307 (H) 65 - 99 mg/dL   Comment 1 Capillary Specimen   I-STAT, chem 8     Status: Abnormal   Collection Time: 2015-10-21 10:09 PM  Result Value Ref Range   Sodium 139 135 - 145 mmol/L   Potassium 4.6 3.5 - 5.1 mmol/L   Chloride 106 101 - 111 mmol/L   BUN 16 6 - 20 mg/dL   Creatinine, Ser 9.14 0.61 - 1.24 mg/dL   Glucose, Bld 782 (H) 65 - 99 mg/dL   Calcium, Ion 9.56 (L) 1.13 - 1.30 mmol/L   TCO2 22 0 - 100 mmol/L   Hemoglobin 13.3 13.0 - 17.0 g/dL   HCT 21.3 08.6 - 57.8 %  MRSA PCR Screening     Status: None   Collection Time: 10/21/15 10:28 PM  Result Value Ref Range   MRSA by PCR NEGATIVE NEGATIVE  Lactic acid, plasma     Status: Abnormal   Collection  Time: 09/27/2015 10:43 PM  Result Value Ref Range   Lactic Acid, Venous 2.2 (HH) 0.5 - 2.0 mmol/L  Glucose, capillary     Status: Abnormal   Collection Time: 10/11/15 12:05 AM  Result Value Ref Range   Glucose-Capillary 325 (H) 65 - 99 mg/dL   Comment 1 Arterial Specimen   I-STAT, chem 8     Status: Abnormal   Collection Time: 10/11/15 12:06 AM  Result Value Ref Range   Sodium 138 135 - 145 mmol/L   Potassium 4.7 3.5 - 5.1 mmol/L   Chloride 108 101 - 111 mmol/L   BUN 17 6 - 20 mg/dL   Creatinine, Ser 1.61 0.61 - 1.24 mg/dL   Glucose, Bld 096 (H) 65 - 99 mg/dL   Calcium, Ion 0.45 (L) 1.13 - 1.30 mmol/L   TCO2 16 0 - 100 mmol/L   Hemoglobin 12.6 (L) 13.0 - 17.0 g/dL   HCT 40.9 (L) 81.1 - 91.4 %  Glucose, capillary     Status:  Abnormal   Collection Time: 10/11/15  1:16 AM  Result Value Ref Range   Glucose-Capillary 340 (H) 65 - 99 mg/dL  Glucose, capillary     Status: Abnormal   Collection Time: 10/11/15  2:25 AM  Result Value Ref Range   Glucose-Capillary 323 (H) 65 - 99 mg/dL   Comment 1 Arterial Specimen   I-STAT, chem 8     Status: Abnormal   Collection Time: 10/11/15  2:27 AM  Result Value Ref Range   Sodium 139 135 - 145 mmol/L   Potassium 3.8 3.5 - 5.1 mmol/L   Chloride 108 101 - 111 mmol/L   BUN 16 6 - 20 mg/dL   Creatinine, Ser 7.82 0.61 - 1.24 mg/dL   Glucose, Bld 956 (H) 65 - 99 mg/dL   Calcium, Ion 2.13 0.86 - 1.30 mmol/L   TCO2 17 0 - 100 mmol/L   Hemoglobin 12.9 (L) 13.0 - 17.0 g/dL   HCT 57.8 (L) 46.9 - 62.9 %  Glucose, capillary     Status: Abnormal   Collection Time: 10/11/15  3:22 AM  Result Value Ref Range   Glucose-Capillary 291 (H) 65 - 99 mg/dL  Blood gas, arterial     Status: Abnormal   Collection Time: 10/11/15  3:35 AM  Result Value Ref Range   FIO2 0.50    Delivery systems VENTILATOR    Mode PRESSURE REGULATED VOLUME CONTROL    VT 620 mL   LHR 22 resp/min   Peep/cpap 5.0 cm H20   pH, Arterial 7.468 (H) 7.350 - 7.450   pCO2 arterial 24.2 (L) 35.0 - 45.0 mmHg   pO2, Arterial 155 (H) 80.0 - 100.0 mmHg   Bicarbonate 17.3 (L) 20.0 - 24.0 mEq/L   TCO2 18.0 0 - 100 mmol/L   Acid-base deficit 5.8 (H) 0.0 - 2.0 mmol/L   O2 Saturation 99.3 %   Patient temperature 98.6    Collection site A-LINE    Drawn by [id number]   Heparin level (unfractionated)     Status: None   Collection Time: 10/11/15  4:00 AM  Result Value Ref Range   Heparin Unfractionated 0.37 0.30 - 0.70 IU/mL  CBC     Status: Abnormal   Collection Time: 10/11/15  4:00 AM  Result Value Ref Range   WBC 7.8 4.0 - 10.5 K/uL   RBC 4.24 4.22 - 5.81 MIL/uL   Hemoglobin 12.6 (L) 13.0 - 17.0 g/dL   HCT 24.4 (L) 01.0 - 27.2 %  MCV 90.6 78.0 - 100.0 fL   MCH 29.7 26.0 - 34.0 pg   MCHC 32.8 30.0 - 36.0 g/dL   RDW  16.1 09.6 - 04.5 %   Platelets 173 150 - 400 K/uL  Troponin I     Status: Abnormal   Collection Time: 10/11/15  4:00 AM  Result Value Ref Range   Troponin I 0.29 (H) <0.031 ng/mL  Basic metabolic panel     Status: Abnormal   Collection Time: 10/11/15  4:00 AM  Result Value Ref Range   Sodium 141 135 - 145 mmol/L   Potassium 3.1 (L) 3.5 - 5.1 mmol/L   Chloride 110 101 - 111 mmol/L   CO2 19 (L) 22 - 32 mmol/L   Glucose, Bld 258 (H) 65 - 99 mg/dL   BUN 13 6 - 20 mg/dL   Creatinine, Ser 4.09 0.61 - 1.24 mg/dL   Calcium 8.6 (L) 8.9 - 10.3 mg/dL   GFR calc non Af Amer >60 >60 mL/min   GFR calc Af Amer >60 >60 mL/min   Anion gap 12 5 - 15  Basic metabolic panel     Status: Abnormal   Collection Time: 10/11/15  4:00 AM  Result Value Ref Range   Sodium 138 135 - 145 mmol/L   Potassium 3.0 (L) 3.5 - 5.1 mmol/L   Chloride 110 101 - 111 mmol/L   CO2 18 (L) 22 - 32 mmol/L   Glucose, Bld 262 (H) 65 - 99 mg/dL   BUN 13 6 - 20 mg/dL   Creatinine, Ser 8.11 0.61 - 1.24 mg/dL   Calcium 8.6 (L) 8.9 - 10.3 mg/dL   GFR calc non Af Amer >60 >60 mL/min   GFR calc Af Amer >60 >60 mL/min   Anion gap 10 5 - 15  Protime-INR now and repeat in 8 hours     Status: None   Collection Time: 10/11/15  4:00 AM  Result Value Ref Range   Prothrombin Time 15.1 11.6 - 15.2 seconds   INR 1.18 0.00 - 1.49  APTT now and repeat in 8 hours     Status: Abnormal   Collection Time: 10/11/15  4:00 AM  Result Value Ref Range   aPTT 62 (H) 24 - 37 seconds  Magnesium     Status: None   Collection Time: 10/11/15  4:00 AM  Result Value Ref Range   Magnesium 2.1 1.7 - 2.4 mg/dL  Phosphorus     Status: Abnormal   Collection Time: 10/11/15  4:00 AM  Result Value Ref Range   Phosphorus 1.6 (L) 2.5 - 4.6 mg/dL  TSH     Status: None   Collection Time: 10/11/15  4:00 AM  Result Value Ref Range   TSH 0.621 0.350 - 4.500 uIU/mL  I-STAT, chem 8     Status: Abnormal   Collection Time: 10/11/15  4:20 AM  Result Value Ref  Range   Sodium 142 135 - 145 mmol/L   Potassium 3.1 (L) 3.5 - 5.1 mmol/L   Chloride 109 101 - 111 mmol/L   BUN 15 6 - 20 mg/dL   Creatinine, Ser 9.14 0.61 - 1.24 mg/dL   Glucose, Bld 782 (H) 65 - 99 mg/dL   Calcium, Ion 9.56 2.13 - 1.30 mmol/L   TCO2 18 0 - 100 mmol/L   Hemoglobin 13.6 13.0 - 17.0 g/dL   HCT 08.6 57.8 - 46.9 %  Glucose, capillary     Status: Abnormal   Collection Time: 10/11/15  4:20 AM  Result Value Ref Range   Glucose-Capillary 246 (H) 65 - 99 mg/dL  Lactic acid, plasma     Status: Abnormal   Collection Time: 10/11/15  5:00 AM  Result Value Ref Range   Lactic Acid, Venous 2.5 (HH) 0.5 - 2.0 mmol/L  Glucose, capillary     Status: Abnormal   Collection Time: 10/11/15  5:06 AM  Result Value Ref Range   Glucose-Capillary 202 (H) 65 - 99 mg/dL   Comment 1 Arterial Specimen     Studies/Results: Dg Chest Port 1 View  May 15, 2016  CLINICAL DATA:  65 year old male status post central line and enteric tube placement. EXAM: PORTABLE CHEST 1 VIEW COMPARISON:  Radiograph dated 0Oct 26, 2017 FINDINGS: Endotracheal tube with tip approximately 4.6 cm above the carina. And enteric tube is partially visualized coursing to the left upper abdomen with tip beyond the image margin. Right IJ central line with tip over central SVC. Other support line overlying the patient. Single-view of the chest demonstrate clear lungs. There is no pleural effusion or pneumothorax. There is stable cardiomegaly. No acute osseous pathology identified. IMPRESSION: Right IJ central line with tip over central SVC.  No pneumothorax. Enteric tube coursing towards the left upper abdomen. Endotracheal tube remains above the carina. Electronically Signed   By: Elgie CollardArash  Radparvar M.D.   On: 0Oct 26, 2017 22:35   Dg Chest Port 1 View  May 15, 2016  CLINICAL DATA:  Found unresponsive. Status post CPR. Status post intubation. Acute respiratory failure. EXAM: PORTABLE CHEST 1 VIEW COMPARISON:  09/26/2010 FINDINGS: Technically  suboptimal exam due to patient positioning. Endotracheal tube is seen with tip approximately 4 cm above the carina. Heart size remains within normal limits allowing for positioning. No evidence of pulmonary consolidation to or pleural effusion. Although the lung apices are not visualized, no definite pneumothorax seen. IMPRESSION: Technically suboptimal exam. Endotracheal tube in appropriate position. No other acute findings. Electronically Signed   By: Myles RosenthalJohn  Stahl M.D.   On: 0Oct 26, 2017 19:45   Dg Abd Portable 1v  May 15, 2016  CLINICAL DATA:  65 year old male status post central line and enteric tube placement. EXAM: PORTABLE ABDOMEN - 1 VIEW COMPARISON:  None. FINDINGS: Enteric tube is noted which loops in the left upper abdomen with tip extending down and positioned over the L3 vertebra. No evidence of bowel obstruction. No free air. Multiple radiopaque foci over the renal silhouette bilaterally likely represent kidney stones. No acute osseous pathology identified. IMPRESSION: Enteric tube loops around in the left upper abdomen with tip over the L3 vertebra. No bowel dilatation. Probable small bilateral renal calculi. Electronically Signed   By: Elgie CollardArash  Radparvar M.D.   On: 0Oct 26, 2017 22:38    Medications: Reviewed  @PROBHOSP @  1  VF arrest  Pt in cooling protocol  No further arrhythmia  2  CAD  Cath yesterday with LAD: 90% prox; D1 80%, 90%; PRox LCx 80%; OM1 100%; OM2 95%  Mid LCx 100%; RCA100% ostial    Pt with  IABP    Depending on neuro recovery will need to contact surgery  3 Hx HTN  4  DM  Follow glu    5  HL  Lipitor 80    LOS: 1 day   Clarence Patesaula Awad Gladd 10/11/2015, 8:12 AM

## 2015-10-11 NOTE — Progress Notes (Signed)
ANTICOAGULATION CONSULT NOTE - Follow-up Consult  Pharmacy Consult for Heparin  Indication: IABP  Allergies  Allergen Reactions  . Ace Inhibitors Swelling  . Lisinopril     Throat swelling     Patient Measurements: Height: 6' (182.9 cm) Weight: 205 lb 7.5 oz (93.2 kg) IBW/kg (Calculated) : 77.6   Vital Signs: Temp: 91.6 F (33.1 C) (03/23 0400) BP: 134/80 mmHg (03/23 0200) Pulse Rate: 68 (03/23 0400)  Labs:  Recent Labs  2015-11-18 1928  2015-11-18 2200 2015-11-18 2209 10/11/15 0006 10/11/15 0227 10/11/15 0400  HGB 13.1  < >  --  13.3 12.6* 12.9* 12.6*  HCT 41.1  < >  --  39.0 37.0* 38.0* 38.4*  PLT 192  --   --   --   --   --  173  APTT  --   --  58*  --   --   --  62*  LABPROT  --   --  15.4*  --   --   --  15.1  INR  --   --  1.20  --   --   --  1.18  HEPARINUNFRC  --   --   --   --   --   --  0.37  CREATININE 1.38*  < > 1.19 0.90 0.90 0.80  --   TROPONINI  --   --  0.07*  --   --   --   --   < > = values in this interval not displayed.  Estimated Creatinine Clearance: 112 mL/min (by C-G formula based on Cr of 0.8).   Assessment: [age] yom admitted Mar 28, 2016 with STEMI. S/p cardiac arrest. Pt received CPR and 2 episodes w/ ROSC. Started on hypothermic protocol. S/p PCI and IABP inserted 3/22. Pharmacy consulted to dose heparin for ACS/IABP. Heparin level therapeutic on 900 units/hr. CBC stable. No bleeding noted.  Goal of Therapy:  HL 0.2-0.5 Monitor platelets by anticoagulation protocol: Yes   Plan:  Continue heparin 900 units/hr F/u 6h confirmatory heparin level   Christoper Fabianaron Laiyah Exline, PharmD, BCPS Clinical pharmacist, pager [phone] 10/11/2015 5:03 AM

## 2015-10-11 NOTE — Progress Notes (Signed)
eLink Physician-Brief Progress Note Patient Name: Clarence Brooks DOB: [birth year] MRN: [id number]   Date of Service  10/11/2015  HPI/Events of Note  Notified by bedside nurse a patient with fluctuation and water temperature for cooling. Suspect patient is trying to become febrile. Currently paralyzed.  eICU Interventions  Checking stat blood, urine, and tracheal aspirate cultures. Holding on antibiotic therapy at this time.     Intervention Category Intermediate Interventions: Infection - evaluation and management  Lawanda CousinsJennings Lyla Jasek 10/11/2015, 10:33 PM

## 2015-10-11 NOTE — Progress Notes (Signed)
Dr. Otelia LimesLindzen, Neuro MD, and Felicie Mornavid Smith PA to bedside due to pt's EEG reading. Verbal orders given to this RN to give pt 16mg  Versed bolus and to increase gtt's max hourly dose based on their reading of EEG. Dr. Molli KnockYacoub with CCM updated by Felicie Mornavid Smith PA and gave verbal orders for versed gtt to not exceed 20mg /hr. Bolus verified with pharmacy. Bolus given by this RN with MD at bedside. Drip increased to 8mg /hr. Will continue to monitor and assess pt closely.

## 2015-10-11 NOTE — Progress Notes (Signed)
PULMONARY / CRITICAL CARE MEDICINE   Name: Clarence Brooks MRN: [id number] DOB: 1950-12-09    ADMISSION DATE:  09/27/2015 CONSULTATION DATE:  10/07/2015  REFERRING MD:  Claiborne Billings  CHIEF COMPLAINT:  Cardiac Arrest  HISTORY OF PRESENT ILLNESS:  Pt is encephelopathic; therefore, this HPI is obtained from chart review. Clarence Brooks is a 65 y.o. male with PMH as outlined below including HTN, HLD, DM2, obesity.  He was brought to [facility] ED 03/22 via EMS after a witnessed out of hospital VF arrest with EKG revealing anterolateral STEMI.  CPR was apparently immediately started on the scene and on EMS arrival, he was given 2 rounds of epi as well as 1 defibrillation prior to ROSC.  He had king airway placed in the field and this was switched to ETT in ED.  On arrival to ED, he was met by cardiology team who took him for emergent cardiac cath. Per RN report, pt had severe 3 vessel disease that was not amenable to PCI.  He had IABP placed for EF of roughly 25% and will need CVTS input.  Following cath, he returned to the ICU and hypothermia protocol was initiated.  SUBJECTIVE:  On vent, unresponsive, sedated and paralyzed.  VITAL SIGNS: BP 124/78 mmHg  Pulse 55  Temp(Src) 90.9 F (32.7 C) (Core (Comment))  Resp 22  Ht 6' (1.829 m)  Wt 93.2 kg (205 lb 7.5 oz)  BMI 27.86 kg/m2  SpO2 100%  HEMODYNAMICS: CVP:  [5 mmHg-9 mmHg] 5 mmHg  VENTILATOR SETTINGS: Vent Mode:  [-] PRVC FiO2 (%):  [50 %-100 %] 50 % Set Rate:  [15 bmp-22 bmp] 22 bmp Vt Set:  [600 mL-620 mL] 620 mL PEEP:  [5 cmH20] 5 cmH20 Plateau Pressure:  [17 cmH20-19 cmH20] 17 cmH20  INTAKE / OUTPUT: I/O last 3 completed shifts: In: 782.9 [I.V.:482.9; IV Piggyback:300] Out: 3000 [Urine:3000]   PHYSICAL EXAMINATION: General: Adult AA male, resting in bed, critically ill. Neuro: Sedated and paralyzed, does not follow commands. HEENT: [place]/AT. PERRL, sclerae anicteric. Cardiovascular: RRR, no M/R/G.  Lungs: Respirations even and  unlabored.  CTA bilaterally, No W/R/R. Abdomen: BS x 4, soft, NT/ND.  Musculoskeletal: No gross deformities, no edema.  Skin: Intact, warm, no rashes.  LABS:  BMET  Recent Labs Lab 09/26/2015 1928  10/14/2015 2200  10/11/15 0227 10/11/15 0400 10/11/15 0420  NA 139  < > 136  < > 139 141  138 142  K 3.3*  < > 4.6  < > 3.8 3.1*  3.0* 3.1*  CL 104  < > 107  < > 108 110  110 109  CO2 18*  --  16*  --   --  19*  18*  --   BUN 16  < > 15  < > _0 CREATININE 1.38*  < > 1.19  < > 0.80 1.01  1.01 0.90  GLUCOSE 398*  < > 336*  < > 343* 258*  262* 261*  < > = values in this interval not displayed.  Electrolytes  Recent Labs Lab 09/24/2015 1928 10/14/2015 2200 10/11/15 0400  CALCIUM 8.8* 8.0* 8.6*  8.6*  MG  --   --  2.1  PHOS  --   --  1.6*    CBC  Recent Labs Lab 10/03/2015 1928  10/11/15 0227 10/11/15 0400 10/11/15 0420  WBC 8.9  --   --  7.8  --   HGB 13.1  < > 12.9* 12.6* 13.6  HCT 41.1  < >  38.0* 38.4* 40.0  PLT 192  --   --  173  --   < > = values in this interval not displayed.  Coag's  Recent Labs Lab 10/09/2015 2200 10/11/15 0400  APTT 58* 62*  INR 1.20 1.18    Sepsis Markers  Recent Labs Lab 09/25/2015 1936 10/16/2015 2243 10/11/15 0500  LATICACIDVEN 7.67* 2.2* 2.5*    ABG  Recent Labs Lab 09/19/2015 2205 10/11/15 0335  PHART 7.394 7.468*  PCO2ART 32.7* 24.2*  PO2ART 401.0* 155*    Liver Enzymes  Recent Labs Lab 10/07/2015 1928  AST 150*  ALT 166*  ALKPHOS 72  BILITOT 0.8  ALBUMIN 3.4*    Cardiac Enzymes  Recent Labs Lab 10/19/2015 2200 10/11/15 0400  TROPONINI 0.07* 0.29*    Glucose  Recent Labs Lab 10/11/15 0005 10/11/15 0116 10/11/15 0225 10/11/15 0322 10/11/15 0420 10/11/15 0506  GLUCAP 325* 340* 323* 291* 246* 202*    Imaging Dg Chest Port 1 View  09/25/2015  CLINICAL DATA:  65 year old male status post central line and enteric tube placement. EXAM: PORTABLE CHEST 1 VIEW COMPARISON:  Radiograph dated  10/03/2015 FINDINGS: Endotracheal tube with tip approximately 4.6 cm above the carina. And enteric tube is partially visualized coursing to the left upper abdomen with tip beyond the image margin. Right IJ central line with tip over central SVC. Other support line overlying the patient. Single-view of the chest demonstrate clear lungs. There is no pleural effusion or pneumothorax. There is stable cardiomegaly. No acute osseous pathology identified. IMPRESSION: Right IJ central line with tip over central SVC.  No pneumothorax. Enteric tube coursing towards the left upper abdomen. Endotracheal tube remains above the carina. Electronically Signed   By: Anner Crete M.D.   On: 09/29/2015 22:35   Dg Chest Port 1 View  10/15/2015  CLINICAL DATA:  Found unresponsive. Status post CPR. Status post intubation. Acute respiratory failure. EXAM: PORTABLE CHEST 1 VIEW COMPARISON:  09/26/2010 FINDINGS: Technically suboptimal exam due to patient positioning. Endotracheal tube is seen with tip approximately 4 cm above the carina. Heart size remains within normal limits allowing for positioning. No evidence of pulmonary consolidation to or pleural effusion. Although the lung apices are not visualized, no definite pneumothorax seen. IMPRESSION: Technically suboptimal exam. Endotracheal tube in appropriate position. No other acute findings. Electronically Signed   By: Earle Gell M.D.   On: 09/23/2015 19:45   Dg Abd Portable 1v  09/26/2015  CLINICAL DATA:  65 year old male status post central line and enteric tube placement. EXAM: PORTABLE ABDOMEN - 1 VIEW COMPARISON:  None. FINDINGS: Enteric tube is noted which loops in the left upper abdomen with tip extending down and positioned over the L3 vertebra. No evidence of bowel obstruction. No free air. Multiple radiopaque foci over the renal silhouette bilaterally likely represent kidney stones. No acute osseous pathology identified. IMPRESSION: Enteric tube loops around in the  left upper abdomen with tip over the L3 vertebra. No bowel dilatation. Probable small bilateral renal calculi. Electronically Signed   By: Anner Crete M.D.   On: 10/01/2015 22:38   STUDIES:  CXR 03/22 > no acute process.  CULTURES: None.  ANTIBIOTICS: None.  SIGNIFICANT EVENTS: 03/22 > admitted after out of hospital VF arrest.    LINES/TUBES: ETT 03/22 > CVL pending 03/22 > A line pending 03/22 >  DISCUSSION: 65 y.o. M admitted 03/22 after out of hospital VF arrest.  He was taken to cath lab for emergent cardiac cath and per RN, this  revealed severe 3 vessel disease not amenable to PCI.  He had EF of 25% so IABP was placed. Following procedure, he returned to ICU where hypothermia protocol was initiated.  ASSESSMENT / PLAN:  CARDIOVASCULAR A:  Out of hospital VF arrest - s/p cardiac cath with reported severe 3 vessel disease not amenable to PCI.  He had EF of 25% so IABP was placed. sCHF - reported EF of ~25% during cardiac cath - official report pending. Hx HTN, HLD. P:  Continue hypothermia protocol, goal temp 33C. Goal MAP > 80 during hypothermia protocol. Levophed as needed for above goal. Trend troponins / lactate. Assess echo. Cardiology following. Will need CVTS consult for CABG evaluation.  NEUROLOGIC A:   Acute metabolic encephalopathy. Hx anxiety. P:   Sedation:  Cisatracurium gtt / Fentanyl gtt / Midazolam gtt. RASS goal: -5 during hypothermia protocol. Hold daily WUA while under paralysis. EEG being done Neuro consult once rewarmed.  PULMONARY A: VDRF - due to respiratory insufficiency in the setting of cardiac arrest. P:   Full vent support. Wean as able. VAP prevention measures. Hold SBT until off paralytics. Albuterol PRN. CXR in AM.  RENAL A:   Hypokalemia - anticipate worsening during hypothermia protocol. Hypocalcemia. AGMA - lactate. P:   BMP q2hrs x 4.  Replace electrolytes as indicated. NS @ 100. BMP in  AM.  GASTROINTESTINAL A:   GI prophylaxis. Nutrition. P:  SUP: Pantoprazole. NPO until no longer paralyzed.  HEMATOLOGIC A:   VTE Prophylaxis. P:  SCD's / heparin. Coags q8hrs x 2. CBC in AM.  INFECTIOUS A:   No indication of infection. P:   Monitor clinically.  ENDOCRINE A:   DM  2. P:   ICU hyperglycemia protocol. TSH WNL.  Family updated: Wife updated bedside.  Interdisciplinary Family Meeting v Palliative Care Meeting:  Due by: 03/28.  The patient is critically ill with multiple organ systems failure and requires high complexity decision making for assessment and support, frequent evaluation and titration of therapies, application of advanced monitoring technologies and extensive interpretation of multiple databases.   Critical Care Time devoted to patient care services described in this note is  35  Minutes. This time reflects time of care of this signee Dr Jennet Maduro. This critical care time does not reflect procedure time, or teaching time or supervisory time of PA/NP/Med student/Med Resident etc but could involve care discussion time.  Rush Farmer, M.D. [facility] Pulmonary/Critical Care Medicine. Pager: [phone]. After hours pager: [phone].  10/11/2015, 10:01 AM

## 2015-10-11 NOTE — Procedures (Signed)
       Electroencephalogram Procedure Note  Mr. Fredirick MaudlinWilson Teuscher Date of Birth:  10/31/1950 Medical Record Number:  [id number]   Indications: Diagnostic Date of Procedure 10/11/2015 Medications:versed fentanyl and nimbex Clinical history : [age] year male with cardaic arrest s/p therepeutic hypothermia with temp 32 degrees centigrade x 1 day Technical Description  This study was performed using 17 channel digital electroencephalographic recording equipment. International 10-20 electrode placement was used. The record was obtained with the patient comatosed and sedated and induced hypothermia.  The record is of fair technical quality for purposes of interpretation.   Activation Procedures:  none . EEG Description  The record is highly disorganized with no normal features noted and  consists of entirely low amplitude suppressed almost flat background mostly superimposed with paroxysmal high amplitude discharges lasting 1-2 secs occuring approximately every 1-3 seconds. These are likely epileptiform in nature Technical component of study is adequate. Length of recording 22 mins   Summary Highly abnormal EEG recording showing burst suppression epileptiform pattern suggestive of poor prognosis in cardiac arrest situation  Delia HeadyPramod Sethi, MD

## 2015-10-11 NOTE — Consult Note (Signed)
NEURO HOSPITALIST CONSULT NOTE   Requestig physician: Dr. Nelda Marseille   Reason for Consult: seizure   History obtained from:  Chart     HPI:                                                                                                                                          Clarence Brooks is an 65 y.o. male with PMH as outlined below including HTN, HLD, DM2, obesity. He was brought to [facility] ED 03/22 via EMS after a witnessed out of hospital VF arrest with EKG revealing anterolateral STEMI.  CPR was apparently immediately started on the scene and on EMS arrival, he was given 2 rounds of epi as well as 1 defibrillation prior to ROSC. He had king airway placed in the field and this was switched to ETT in ED.  On arrival to ED, he was met by cardiology team who took him for emergent cardiac cath. Per RN report, pt had severe 3 vessel disease that was not amenable to PCI. He had IABP placed for EF of roughly 25% and will need CVTS input.  Following cath, he returned to the ICU and hypothermia protocol was initiated 3/22 at 2230.   EEG ordered and showed burst suppression but the burst were very epileptiform. He was bolused with 16 mg Versed and then set at 8 mg/Hr.  He currently is on Versed, Fentanyl and Nimbex.    Past Medical History  Diagnosis Date  . Diabetes mellitus without complication ([place])   . Hypertension   . Dyslipidemia   . Dizziness   . Anxiety   . Osteoarthritis   . Special screening for malignant neoplasm of prostate   . Encounter for long-term (current) use of other medications     Past Surgical History  Procedure Laterality Date  . Knee surgery      bilateral  . Umbilical hernia repair    . Carpal tunnel release      bilateral  . Ankle surgery    . Cardiac catheterization N/A 09/21/2015    Procedure: Left Heart Cath and Coronary Angiography;  Surgeon: Troy Sine, MD;  Location: [facility];  Service: Cardiovascular;  Laterality: N/A;   . Cardiac catheterization N/A 10/08/2015    Procedure: IABP Insertion;  Surgeon: Troy Sine, MD;  Location: [facility];  Service: Cardiovascular;  Laterality: N/A;    Family History  Problem Relation Age of Onset  . Hypertension Mother   . Cancer Father     Throat  . Hypertension Brother      Social History:  reports that he has never smoked. He does not have any smokeless tobacco history on file. His alcohol and drug histories are not on file.  Allergies  Allergen  Reactions  . Ace Inhibitors Swelling  . Lisinopril     Throat swelling     MEDICATIONS:                                                                                                                     Prior to Admission:  Prescriptions prior to admission  Medication Sig Dispense Refill Last Dose  . AMLODIPINE BESYLATE PO Take 10 mg by mouth daily.   Taking  . hydrochlorothiazide (HYDRODIURIL) 25 MG tablet Take 25 mg by mouth daily.   Taking  . insulin aspart (NOVOLOG) 100 UNIT/ML injection Inject 15 Units into the skin 3 (three) times daily with meals.     . Insulin Glargine (LANTUS OPTICLIK) 100 UNIT/ML SOCT Inject 28 Units into the skin daily.   Taking  . Multiple Vitamins-Minerals (EQ COMPLETE MULTIVIT ADULT 50+ PO) Take by mouth.   Taking   Scheduled: . sodium chloride  2,000 mL Intravenous Once  . antiseptic oral rinse  7 mL Mouth Rinse 10 times per day  . artificial tears  1 application Both Eyes 3 times per day  . aspirin  81 mg Oral Daily  . atorvastatin  80 mg Oral q1800  . chlorhexidine gluconate  15 mL Mouth Rinse BID  . levETIRAcetam  1,000 mg Intravenous STAT  . levETIRAcetam  1,000 mg Intravenous Q12H  . midazolam  16 mg Intravenous Once  . pantoprazole (PROTONIX) IV  40 mg Intravenous QHS  . potassium phosphate IVPB (mmol)  20 mmol Intravenous Once  . sodium chloride flush  10-40 mL Intracatheter Q12H  . sodium chloride flush  3 mL Intravenous Q12H   Continuous: . sodium  chloride 20 mL/hr at 10/05/2015 2308  . cisatracurium (NIMBEX) infusion 1 mcg/kg/min (10/13/2015 2330)  . fentaNYL infusion INTRAVENOUS 325 mcg/hr (10/11/15 0930)  . heparin 900 Units/hr (10/15/2015 2056)  . insulin (NOVOLIN-R) infusion 1.7 Units/hr (10/11/15 0900)  . midazolam (VERSED) infusion    . norepinephrine (LEVOPHED) Adult infusion 0 mcg/min (10/11/15 0639)     ROS:                                                                                                                                       History obtained from unobtainable from patient due to intubated and sedated.    Blood pressure 136/85, pulse 59, temperature 90.9 F (32.7 C), temperature source Core (Comment), resp. rate  22, height 6' (1.829 m), weight 93.2 kg (205 lb 7.5 oz), SpO2 100 %.   Neurologic Examination:                                                                                                      HEENT-  Normocephalic, no lesions, without obvious abnormality.  Cardiovascular- S1, S2 normal, pulses palpable throughout   Extremities- warm and well perfused  Neurological Examination Intubated, sedated and paralyzed.  Mental Status: Patient does not respond to verbal stimuli.  Does not respond to deep sternal rub.  Does not follow commands.  No verbalizations noted. HE is paralyzed on Nimbex.  Cranial Nerves: II: patient does not respond confrontation bilaterally, pupils right 2 mm, left 2 mm,and non-reactive bilaterally due to Nimbex bilaterally III,IV,VI: doll's response absent bilaterally. V,VII: corneal reflex absent bilaterally  --on Nimbex VIII: patient does not respond to verbal stimuli IX,X: gag reflex absent--on nimbex, XI: trapezius strength unable to test bilaterally XII: tongue strength unable to test Motor: Extremities flaccid throughout.  No spontaneous movement noted.  No purposeful movements noted. Sensory: Does not respond to noxious stimuli in any extremity. Deep Tendon Reflexes:   Absent throughout. Plantars: absent bilaterally Cerebellar: Unable to perform   Lab Results: Basic Metabolic Panel:  Recent Labs Lab 10/01/2015 1928  10/08/2015 2200 09/30/2015 2209 10/11/15 0006 10/11/15 0227 10/11/15 0400 10/11/15 0420  NA 139  < > 136 139 138 139 141  138 142  K 3.3*  < > 4.6 4.6 4.7 3.8 3.1*  3.0* 3.1*  CL 104  < > 107 106 108 108 110  110 109  CO2 18*  --  16*  --   --   --  19*  18*  --   GLUCOSE 398*  < > 336* 335* 368* 343* 258*  262* 261*  BUN 16  < > 15 16 17 16 13  13 15   CREATININE 1.38*  < > 1.19 0.90 0.90 0.80 1.01  1.01 0.90  CALCIUM 8.8*  --  8.0*  --   --   --  8.6*  8.6*  --   MG  --   --   --   --   --   --  2.1  --   PHOS  --   --   --   --   --   --  1.6*  --   < > = values in this interval not displayed.  Liver Function Tests:  Recent Labs Lab 10/05/2015 1928  AST 150*  ALT 166*  ALKPHOS 72  BILITOT 0.8  PROT 7.2  ALBUMIN 3.4*   No results for input(s): LIPASE, AMYLASE in the last 168 hours. No results for input(s): AMMONIA in the last 168 hours.  CBC:  Recent Labs Lab 09/29/2015 1928  10/08/2015 2209 10/11/15 0006 10/11/15 0227 10/11/15 0400 10/11/15 0420  WBC 8.9  --   --   --   --  7.8  --   NEUTROABS 3.9  --   --   --   --   --   --  HGB 13.1  < > 13.3 12.6* 12.9* 12.6* 13.6  HCT 41.1  < > 39.0 37.0* 38.0* 38.4* 40.0  MCV 94.3  --   --   --   --  90.6  --   PLT 192  --   --   --   --  173  --   < > = values in this interval not displayed.  Cardiac Enzymes:  Recent Labs Lab 09/23/2015 2200 10/11/15 0400  TROPONINI 0.07* 0.29*    Lipid Panel: No results for input(s): CHOL, TRIG, HDL, CHOLHDL, VLDL, LDLCALC in the last 168 hours.  CBG:  Recent Labs Lab 10/11/15 0116 10/11/15 0225 10/11/15 0322 10/11/15 0420 10/11/15 0506  GLUCAP 340* 323* 291* 246* 202*    Microbiology: Results for orders placed or performed during the hospital encounter of 10/02/2015  MRSA PCR Screening     Status: None    Collection Time: 10/19/2015 10:28 PM  Result Value Ref Range Status   MRSA by PCR NEGATIVE NEGATIVE Final    Comment:        The GeneXpert MRSA Assay (FDA approved for NASAL specimens only), is one component of a comprehensive MRSA colonization surveillance program. It is not intended to diagnose MRSA infection nor to guide or monitor treatment for MRSA infections.     Coagulation Studies:  Recent Labs  10/12/2015 2200 10/11/15 0400  LABPROT 15.4* 15.1  INR 1.20 1.18    Imaging: Dg Chest Port 1 View  10/13/2015  CLINICAL DATA:  65 year old male status post central line and enteric tube placement. EXAM: PORTABLE CHEST 1 VIEW COMPARISON:  Radiograph dated 09/23/2015 FINDINGS: Endotracheal tube with tip approximately 4.6 cm above the carina. And enteric tube is partially visualized coursing to the left upper abdomen with tip beyond the image margin. Right IJ central line with tip over central SVC. Other support line overlying the patient. Single-view of the chest demonstrate clear lungs. There is no pleural effusion or pneumothorax. There is stable cardiomegaly. No acute osseous pathology identified. IMPRESSION: Right IJ central line with tip over central SVC.  No pneumothorax. Enteric tube coursing towards the left upper abdomen. Endotracheal tube remains above the carina. Electronically Signed   By: Anner Crete M.D.   On: 10/11/2015 22:35   Dg Chest Port 1 View  10/17/2015  CLINICAL DATA:  Found unresponsive. Status post CPR. Status post intubation. Acute respiratory failure. EXAM: PORTABLE CHEST 1 VIEW COMPARISON:  09/26/2010 FINDINGS: Technically suboptimal exam due to patient positioning. Endotracheal tube is seen with tip approximately 4 cm above the carina. Heart size remains within normal limits allowing for positioning. No evidence of pulmonary consolidation to or pleural effusion. Although the lung apices are not visualized, no definite pneumothorax seen. IMPRESSION:  Technically suboptimal exam. Endotracheal tube in appropriate position. No other acute findings. Electronically Signed   By: Earle Gell M.D.   On: 10/05/2015 19:45   Dg Abd Portable 1v  10/13/2015  CLINICAL DATA:  65 year old male status post central line and enteric tube placement. EXAM: PORTABLE ABDOMEN - 1 VIEW COMPARISON:  None. FINDINGS: Enteric tube is noted which loops in the left upper abdomen with tip extending down and positioned over the L3 vertebra. No evidence of bowel obstruction. No free air. Multiple radiopaque foci over the renal silhouette bilaterally likely represent kidney stones. No acute osseous pathology identified. IMPRESSION: Enteric tube loops around in the left upper abdomen with tip over the L3 vertebra. No bowel dilatation. Probable small bilateral renal calculi. Electronically  Signed   By: Anner Crete M.D.   On: 09/24/2015 22:38   EEG Description: The record is highly disorganized with no normal features noted andconsists of entirely low amplitude suppressed almost flat background mostly superimposed with paroxysmal high amplitude discharges lasting 1-2 secs occuring approximately every 1-3 seconds. These are likely epileptiform in nature. Summary: Highly abnormal EEG recording showing burst suppression epileptiform pattern suggestive of poor prognosis in cardiac arrest situation   History taking and components of the examination performed by: Etta Quill PA-C [facility] [phone] 10/11/2015, 10:44 AM   Assessment/Plan: 65 year old male S/P cardiac arrest and ROSC after 2 rounds of Epi. Currently on cooling protocol. EEG shows NCSE with incomplete burst suppression. Will bolus with 16 mg Versed and follow up with 8 mg/hour Versed along with Keppra bolus 1 Gram and maintenance 1 Gram BID. He will remain on LTM.  Will continue to follow EEG and medications with you.    Recommendations: 1. Continue Keppra.  2. Titrate Versed to burst suppression  pattern. Currently at 8 mg/hr.  3. Continuous EEG.  4. Frequent neuro checks.   Kerney Elbe, MD

## 2015-10-11 NOTE — H&P (Deleted)
PULMONARY / CRITICAL CARE MEDICINE   Name: Clarence Brooks MRN: [id number] DOB: [birth year]    ADMISSION DATE:  10/02/2015 CONSULTATION DATE:  09/19/2015  REFERRING MD:  Claiborne Billings  CHIEF COMPLAINT:  Cardiac Arrest  HISTORY OF PRESENT ILLNESS:  Pt is encephelopathic; therefore, this HPI is obtained from chart review. Clarence Brooks is a 65 y.o. male with PMH as outlined below including HTN, HLD, DM2, obesity.  He was brought to [facility] ED 03/22 via EMS after a witnessed out of hospital VF arrest with EKG revealing anterolateral STEMI.  CPR was apparently immediately started on the scene and on EMS arrival, he was given 2 rounds of epi as well as 1 defibrillation prior to ROSC.  He had king airway placed in the field and this was switched to ETT in ED.  On arrival to ED, he was met by cardiology team who took him for emergent cardiac cath. Per RN report, pt had severe 3 vessel disease that was not amenable to PCI.  He had IABP placed for EF of roughly 25% and will need CVTS input.  Following cath, he returned to the ICU and hypothermia protocol was initiated.  SUBJECTIVE:  On vent, unresponsive,   VITAL SIGNS: BP 124/78 mmHg  Pulse 55  Temp(Src) 90.9 F (32.7 C) (Core (Comment))  Resp 22  Ht 6' (1.829 m)  Wt 93.2 kg (205 lb 7.5 oz)  BMI 27.86 kg/m2  SpO2 100%  HEMODYNAMICS: CVP:  [5 mmHg-9 mmHg] 5 mmHg  VENTILATOR SETTINGS: Vent Mode:  [-] PRVC FiO2 (%):  [50 %-100 %] 50 % Set Rate:  [15 bmp-22 bmp] 22 bmp Vt Set:  [600 mL-620 mL] 620 mL PEEP:  [5 cmH20] 5 cmH20 Plateau Pressure:  [17 cmH20-19 cmH20] 17 cmH20  INTAKE / OUTPUT: I/O last 3 completed shifts: In: 782.9 [I.V.:482.9; IV Piggyback:300] Out: 3000 [Urine:3000]   PHYSICAL EXAMINATION: General: Adult AA male, resting in bed, critically ill. Neuro: Sedated, does not follow commands. HEENT: [place]/AT. PERRL, sclerae anicteric. Cardiovascular: RRR, no M/R/G.  Lungs: Respirations even and unlabored.  CTA bilaterally, No  W/R/R. Abdomen: BS x 4, soft, NT/ND.  Musculoskeletal: No gross deformities, no edema.  Skin: Intact, warm, no rashes.     LABS:  BMET  Recent Labs Lab 09/28/2015 1928  10/09/2015 2200  10/11/15 0227 10/11/15 0400 10/11/15 0420  NA 139  < > 136  < > 139 141  138 142  K 3.3*  < > 4.6  < > 3.8 3.1*  3.0* 3.1*  CL 104  < > 107  < > 108 110  110 109  CO2 18*  --  16*  --   --  19*  18*  --   BUN 16  < > 15  < > 16 13  13 15   CREATININE 1.38*  < > 1.19  < > 0.80 1.01  1.01 0.90  GLUCOSE 398*  < > 336*  < > 343* 258*  262* 261*  < > = values in this interval not displayed.  Electrolytes  Recent Labs Lab 10/16/2015 1928 10/01/2015 2200 10/11/15 0400  CALCIUM 8.8* 8.0* 8.6*  8.6*  MG  --   --  2.1  PHOS  --   --  1.6*    CBC  Recent Labs Lab 10/01/2015 1928  10/11/15 0227 10/11/15 0400 10/11/15 0420  WBC 8.9  --   --  7.8  --   HGB 13.1  < > 12.9* 12.6* 13.6  HCT 41.1  < >  38.0* 38.4* 40.0  PLT 192  --   --  173  --   < > = values in this interval not displayed.  Coag's  Recent Labs Lab 09/23/2015 2200 10/11/15 0400  APTT 58* 62*  INR 1.20 1.18    Sepsis Markers  Recent Labs Lab 10/16/2015 1936 10/02/2015 2243 10/11/15 0500  LATICACIDVEN 7.67* 2.2* 2.5*    ABG  Recent Labs Lab 09/23/2015 2205 10/11/15 0335  PHART 7.394 7.468*  PCO2ART 32.7* 24.2*  PO2ART 401.0* 155*    Liver Enzymes  Recent Labs Lab 09/30/2015 1928  AST 150*  ALT 166*  ALKPHOS 72  BILITOT 0.8  ALBUMIN 3.4*    Cardiac Enzymes  Recent Labs Lab 10/12/2015 2200 10/11/15 0400  TROPONINI 0.07* 0.29*    Glucose  Recent Labs Lab 10/11/15 0005 10/11/15 0116 10/11/15 0225 10/11/15 0322 10/11/15 0420 10/11/15 0506  GLUCAP 325* 340* 323* 291* 246* 202*    Imaging Dg Chest Port 1 View  10/09/2015  CLINICAL DATA:  65 year old male status post central line and enteric tube placement. EXAM: PORTABLE CHEST 1 VIEW COMPARISON:  Radiograph dated 09/22/2015 FINDINGS:  Endotracheal tube with tip approximately 4.6 cm above the carina. And enteric tube is partially visualized coursing to the left upper abdomen with tip beyond the image margin. Right IJ central line with tip over central SVC. Other support line overlying the patient. Single-view of the chest demonstrate clear lungs. There is no pleural effusion or pneumothorax. There is stable cardiomegaly. No acute osseous pathology identified. IMPRESSION: Right IJ central line with tip over central SVC.  No pneumothorax. Enteric tube coursing towards the left upper abdomen. Endotracheal tube remains above the carina. Electronically Signed   By: Anner Crete M.D.   On: 09/23/2015 22:35   Dg Chest Port 1 View  10/09/2015  CLINICAL DATA:  Found unresponsive. Status post CPR. Status post intubation. Acute respiratory failure. EXAM: PORTABLE CHEST 1 VIEW COMPARISON:  09/26/2010 FINDINGS: Technically suboptimal exam due to patient positioning. Endotracheal tube is seen with tip approximately 4 cm above the carina. Heart size remains within normal limits allowing for positioning. No evidence of pulmonary consolidation to or pleural effusion. Although the lung apices are not visualized, no definite pneumothorax seen. IMPRESSION: Technically suboptimal exam. Endotracheal tube in appropriate position. No other acute findings. Electronically Signed   By: Earle Gell M.D.   On: 10/07/2015 19:45   Dg Abd Portable 1v  10/01/2015  CLINICAL DATA:  65 year old male status post central line and enteric tube placement. EXAM: PORTABLE ABDOMEN - 1 VIEW COMPARISON:  None. FINDINGS: Enteric tube is noted which loops in the left upper abdomen with tip extending down and positioned over the L3 vertebra. No evidence of bowel obstruction. No free air. Multiple radiopaque foci over the renal silhouette bilaterally likely represent kidney stones. No acute osseous pathology identified. IMPRESSION: Enteric tube loops around in the left upper abdomen  with tip over the L3 vertebra. No bowel dilatation. Probable small bilateral renal calculi. Electronically Signed   By: Anner Crete M.D.   On: 10/01/2015 22:38     STUDIES:  CXR 03/22 > no acute process.  CULTURES: None.  ANTIBIOTICS: None.  SIGNIFICANT EVENTS: 03/22 > admitted after out of hospital VF arrest.    LINES/TUBES: ETT 03/22 > CVL pending 03/22 > A line pending 03/22 >  DISCUSSION: 65 y.o. M admitted 03/22 after out of hospital VF arrest.  He was taken to cath lab for emergent cardiac cath and per  RN, this revealed severe 3 vessel disease not amenable to PCI.  He had EF of 25% so IABP was placed. Following procedure, he returned to ICU where hypothermia protocol was initiated.  ASSESSMENT / PLAN:  CARDIOVASCULAR A:  Out of hospital VF arrest - s/p cardiac cath with reported severe 3 vessel disease not amenable to PCI.  He had EF of 25% so IABP was placed. sCHF - reported EF of ~25% during cardiac cath - official report pending. Hx HTN, HLD. P:  Initiate hypothermia protocol, goal temp 33C. Goal MAP > 80 during hypothermia protocol. Levophed as needed for above goal. Trend troponins / lactate. Assess echo. Cardiology following. Will need CVTS consult for CABG evaluation.  NEUROLOGIC A:   Acute metabolic encephalopathy. Hx anxiety. P:   Sedation:  Cisatracurium gtt / Fentanyl gtt / Midazolam gtt. RASS goal: -5 during hypothermia protocol. Hold daily WUA while under paralysis. EEG. Neuro consult once rewarmed.  PULMONARY A: VDRF - due to respiratory insufficiency in the setting of cardiac arrest. P:   Full vent support. Wean as able. VAP prevention measures. Hold SBT until off paralytics. Albuterol PRN. CXR in AM.  RENAL A:   Hypokalemia - anticipate worsening during hypothermia protocol. Hypocalcemia. AGMA - lactate. P:   BMP q2hrs x 4.  Replace electrolytes as indicated. NS @ 100. 1g Ca gluconate. BMP in  AM.  GASTROINTESTINAL A:   GI prophylaxis. Nutrition. P:  SUP: Pantoprazole. NPO.  HEMATOLOGIC A:   VTE Prophylaxis. P:  SCD's / heparin. Coags q8hrs x 2. CBC in AM.  INFECTIOUS A:   No indication of infection. P:   Monitor clinically.  ENDOCRINE A:   DM  2. P:   ICU hyperglycemia protocol. TSH ok.  Family updated: Wife updated bedside.  Interdisciplinary Family Meeting v Palliative Care Meeting:  Due by: 03/28.  The patient is critically ill with multiple organ systems failure and requires high complexity decision making for assessment and support, frequent evaluation and titration of therapies, application of advanced monitoring technologies and extensive interpretation of multiple databases.   Critical Care Time devoted to patient care services described in this note is  35  Minutes. This time reflects time of care of this signee Dr Jennet Maduro. This critical care time does not reflect procedure time, or teaching time or supervisory time of PA/NP/Med student/Med Resident etc but could involve care discussion time.  Rush Farmer, M.D. [facility] Pulmonary/Critical Care Medicine. Pager: [phone]. After hours pager: [phone].  10/11/2015, 10:00 AM

## 2015-10-11 NOTE — Care Management Note (Signed)
Case Management Note  Patient Details  Name: Fredirick MaudlinWilson Weyrauch MRN: [id number] Date of Birth: [birth year]  Subjective/Objective:                 Adm w cardiac arrest, vent   Action/Plan: lives w wife, pcp dr fulp   Expected Discharge Date:                  Expected Discharge Plan:  Home w Home Health Services  In-House Referral:     Discharge planning Services     Post Acute Care Choice:    Choice offered to:     DME Arranged:    DME Agency:     HH Arranged:    HH Agency:     Status of Service:  In process, will continue to follow  Medicare Important Message Given:    Date Medicare IM Given:    Medicare IM give by:    Date Additional Medicare IM Given:    Additional Medicare Important Message give by:     If discussed at Long Length of Stay Meetings, dates discussed:    Additional Comments: ur review done  Hanley HaysDowell, Shianna Bally T, RN 10/11/2015, 7:42 AM

## 2015-10-11 NOTE — Progress Notes (Signed)
  Echocardiogram 2D Echocardiogram has been performed.  Janalyn HarderWest, Kyle Stansell R 10/11/2015, 9:21 AM

## 2015-10-11 NOTE — Progress Notes (Signed)
This RN asked Neuro MD, Dr. Otelia LimesLindzen, about turning versed gtt off once pt rewarmed per protocol. Verbal orders given to not stop versed infusion due to results of continuous EEG.

## 2015-10-11 NOTE — Progress Notes (Signed)
EKG CRITICAL VALUE     12 lead EKG performed.  Critical value noted.  Mylinda LatinaKalie Quick, RN notified.   Quynh Basso C, CCT 10/11/2015 7:42 AM

## 2015-10-11 NOTE — Progress Notes (Signed)
On call MD paged for  K 2.8. Page not returned. Elink RN called and made aware. Also made Elink RN aware of pt's recent increase in ectopy and run of VT. Mg 1.9. Currently has potassium phos infusing. Will  Continue to monitor and assess pt closely.

## 2015-10-11 NOTE — Progress Notes (Signed)
eLink Physician-Brief Progress Note Patient Name: Clarence Brooks DOB: 05-20-1951 MRN: [id number]   Date of Service  10/11/2015  HPI/Events of Note  Nurse notified of intermittent ectopy. Hypokalemia 2.8 & magnesium 1.9. Patient currently paralyzed on hypothermia protocol. Potassium chloride 10 mEq 4 runs ordered IV. Enteric potassium chloride also ordered.  eICU Interventions  1. D/C enteric potassium chloride 2. KCl 10 mEq IV x3 runs additional 3. Magnesium sulfate 1 g IV     Intervention Category Intermediate Interventions: Electrolyte abnormality - evaluation and management  Clarence Brooks 10/11/2015, 3:21 PM

## 2015-10-11 NOTE — Progress Notes (Signed)
Pt lactic acid 2.7 AM nurse aware and will report to rounding CCMD the AM.

## 2015-10-11 NOTE — Progress Notes (Signed)
Routine portable EEG completed; results pending.

## 2015-10-12 ENCOUNTER — Inpatient Hospital Stay (HOSPITAL_COMMUNITY): Payer: Medicare Other

## 2015-10-12 DIAGNOSIS — G9340 Encephalopathy, unspecified: Secondary | ICD-10-CM

## 2015-10-12 LAB — BLOOD GAS, ARTERIAL
Acid-base deficit: 5.8 mmol/L — ABNORMAL HIGH (ref 0.0–2.0)
Bicarbonate: 18.3 mEq/L — ABNORMAL LOW (ref 20.0–24.0)
DRAWN BY: 42624
FIO2: 0.5
MECHVT: 620 mL
O2 SAT: 96.9 %
PATIENT TEMPERATURE: 93.2
PCO2 ART: 27 mmHg — AB (ref 35.0–45.0)
PEEP: 5 cmH2O
PO2 ART: 82.7 mmHg (ref 80.0–100.0)
RATE: 18 resp/min
TCO2: 19.2 mmol/L (ref 0–100)
pH, Arterial: 7.429 (ref 7.350–7.450)

## 2015-10-12 LAB — GLUCOSE, CAPILLARY
GLUCOSE-CAPILLARY: 121 mg/dL — AB (ref 65–99)
GLUCOSE-CAPILLARY: 125 mg/dL — AB (ref 65–99)
GLUCOSE-CAPILLARY: 133 mg/dL — AB (ref 65–99)
GLUCOSE-CAPILLARY: 137 mg/dL — AB (ref 65–99)
GLUCOSE-CAPILLARY: 137 mg/dL — AB (ref 65–99)
GLUCOSE-CAPILLARY: 139 mg/dL — AB (ref 65–99)
Glucose-Capillary: 93 mg/dL (ref 65–99)
Glucose-Capillary: 141 mg/dL — ABNORMAL HIGH (ref 65–99)
Glucose-Capillary: 154 mg/dL — ABNORMAL HIGH (ref 65–99)
Glucose-Capillary: 129 mg/dL — ABNORMAL HIGH (ref 65–99)
Glucose-Capillary: 111 mg/dL — ABNORMAL HIGH (ref 65–99)
Glucose-Capillary: 132 mg/dL — ABNORMAL HIGH (ref 65–99)

## 2015-10-12 LAB — BASIC METABOLIC PANEL
ANION GAP: 8 (ref 5–15)
Anion gap: 8 (ref 5–15)
BUN: 10 mg/dL (ref 6–20)
BUN: 10 mg/dL (ref 6–20)
CALCIUM: 8.2 mg/dL — AB (ref 8.9–10.3)
CHLORIDE: 114 mmol/L — AB (ref 101–111)
CO2: 18 mmol/L — ABNORMAL LOW (ref 22–32)
CO2: 18 mmol/L — AB (ref 22–32)
CREATININE: 0.91 mg/dL (ref 0.61–1.24)
Calcium: 8.2 mg/dL — ABNORMAL LOW (ref 8.9–10.3)
Chloride: 115 mmol/L — ABNORMAL HIGH (ref 101–111)
Creatinine, Ser: 0.76 mg/dL (ref 0.61–1.24)
GFR calc Af Amer: >60 mL/min (ref >60)
GFR calc non Af Amer: >60 mL/min (ref >60)
GLUCOSE: 157 mg/dL — AB (ref 65–99)
Glucose, Bld: 156 mg/dL — ABNORMAL HIGH (ref 65–99)
POTASSIUM: 3.5 mmol/L (ref 3.5–5.1)
Potassium: 3.6 mmol/L (ref 3.5–5.1)
SODIUM: 140 mmol/L (ref 135–145)
Sodium: 141 mmol/L (ref 135–145)

## 2015-10-12 LAB — CBC
HCT: 37.3 % — ABNORMAL LOW (ref 39.0–52.0)
Hemoglobin: 12 g/dL — ABNORMAL LOW (ref 13.0–17.0)
MCH: 29.3 pg (ref 26.0–34.0)
MCHC: 32.2 g/dL (ref 30.0–36.0)
MCV: 91 fL (ref 78.0–100.0)
PLATELETS: 167 10*3/uL (ref 150–400)
RBC: 4.1 MIL/uL — ABNORMAL LOW (ref 4.22–5.81)
RDW: 13.1 % (ref 11.5–15.5)
WBC: 6.6 10*3/uL (ref 4.0–10.5)

## 2015-10-12 LAB — HEPARIN LEVEL (UNFRACTIONATED)
Heparin Unfractionated: 0.37 IU/mL (ref 0.30–0.70)
Heparin Unfractionated: 0.28 IU/mL — ABNORMAL LOW (ref 0.30–0.70)

## 2015-10-12 LAB — PHOSPHORUS: Phosphorus: 3.5 mg/dL (ref 2.5–4.6)

## 2015-10-12 LAB — MAGNESIUM: Magnesium: 1.8 mg/dL (ref 1.7–2.4)

## 2015-10-12 MED ORDER — LEVETIRACETAM 500 MG/5ML IV SOLN
1500.0000 mg | Freq: Two times a day (BID) | INTRAVENOUS | Status: DC
  Administered 2015-10-12 – 2015-10-15 (×7): 1500 mg via INTRAVENOUS
  Filled 2015-10-12 (×9): qty 15

## 2015-10-12 MED ORDER — PROPOFOL 10 MG/ML IV BOLUS
1.0000 mg/kg | Freq: Once | INTRAVENOUS | Status: AC
  Administered 2015-10-12: 93.2 mg via INTRAVENOUS

## 2015-10-12 MED ORDER — PROPOFOL 1000 MG/100ML IV EMUL
5.0000 ug/kg/min | INTRAVENOUS | Status: DC
  Administered 2015-10-12: 5 ug/kg/min via INTRAVENOUS
  Administered 2015-10-12: 20 ug/kg/min via INTRAVENOUS
  Administered 2015-10-13 (×3): 50 ug/kg/min via INTRAVENOUS
  Administered 2015-10-13: 70 ug/kg/min via INTRAVENOUS
  Administered 2015-10-13: 35 ug/kg/min via INTRAVENOUS
  Administered 2015-10-13: 70 ug/kg/min via INTRAVENOUS
  Administered 2015-10-13: 35 ug/kg/min via INTRAVENOUS
  Administered 2015-10-14 – 2015-10-15 (×14): 70 ug/kg/min via INTRAVENOUS
  Filled 2015-10-12 (×23): qty 100

## 2015-10-12 MED ORDER — SODIUM CHLORIDE 0.9 % IV SOLN
100.0000 mg | Freq: Two times a day (BID) | INTRAVENOUS | Status: DC
  Administered 2015-10-12 – 2015-10-15 (×7): 100 mg via INTRAVENOUS
  Filled 2015-10-12 (×14): qty 10

## 2015-10-12 MED ORDER — NOREPINEPHRINE BITARTRATE 1 MG/ML IV SOLN
0.0000 ug/min | INTRAVENOUS | Status: DC
  Administered 2015-10-12: 25 ug/min via INTRAVENOUS
  Administered 2015-10-13: 18 ug/min via INTRAVENOUS
  Administered 2015-10-14 (×2): 15 ug/min via INTRAVENOUS
  Administered 2015-10-15: 40 ug/min via INTRAVENOUS
  Administered 2015-10-15: 50 ug/min via INTRAVENOUS
  Administered 2015-10-15: 23 ug/min via INTRAVENOUS
  Administered 2015-10-16: 32 ug/min via INTRAVENOUS
  Administered 2015-10-16: 18 ug/min via INTRAVENOUS
  Administered 2015-10-17: 30 ug/min via INTRAVENOUS
  Administered 2015-10-18 (×2): 50 ug/min via INTRAVENOUS
  Filled 2015-10-12 (×14): qty 16

## 2015-10-12 MED ORDER — NOREPINEPHRINE BITARTRATE 1 MG/ML IV SOLN
0.0000 ug/min | INTRAVENOUS | Status: DC
  Administered 2015-10-12: 22 ug/min via INTRAVENOUS
  Administered 2015-10-12: 20 ug/min via INTRAVENOUS
  Administered 2015-10-12: 12 ug/min via INTRAVENOUS
  Filled 2015-10-12 (×3): qty 16

## 2015-10-12 MED ORDER — SODIUM CHLORIDE 0.9 % IV SOLN
1000.0000 mg | Freq: Once | INTRAVENOUS | Status: AC
  Administered 2015-10-12: 1000 mg via INTRAVENOUS
  Filled 2015-10-12: qty 10

## 2015-10-12 MED ORDER — PHENYLEPHRINE HCL 10 MG/ML IJ SOLN
30.0000 ug/min | INTRAVENOUS | Status: DC
  Filled 2015-10-12: qty 1

## 2015-10-12 MED ORDER — SODIUM CHLORIDE 0.9 % IV SOLN
200.0000 mg | Freq: Once | INTRAVENOUS | Status: AC
  Administered 2015-10-12: 200 mg via INTRAVENOUS
  Filled 2015-10-12: qty 20

## 2015-10-12 MED ORDER — VALPROATE SODIUM 500 MG/5ML IV SOLN
15.0000 mg/kg/d | Freq: Three times a day (TID) | INTRAVENOUS | Status: DC
  Administered 2015-10-12 – 2015-10-13 (×4): 466 mg via INTRAVENOUS
  Filled 2015-10-12 (×6): qty 4.66

## 2015-10-12 MED ORDER — MAGNESIUM SULFATE 2 GM/50ML IV SOLN
2.0000 g | Freq: Once | INTRAVENOUS | Status: AC
  Administered 2015-10-12: 2 g via INTRAVENOUS
  Filled 2015-10-12: qty 50

## 2015-10-12 NOTE — Progress Notes (Signed)
LTM EEG checked, electrodes in position.  Pt skin condition intact.  No skin breakdown noted. Will continue to monitor.

## 2015-10-12 NOTE — Progress Notes (Signed)
PULMONARY / CRITICAL CARE MEDICINE   Name: Clarence Brooks MRN: [id number] DOB: 1951/06/19    ADMISSION DATE:  10/04/2015 CONSULTATION DATE:  10/01/2015  REFERRING MD:  Claiborne Billings  CHIEF COMPLAINT:  Cardiac Arrest  HISTORY OF PRESENT ILLNESS:  Pt is encephelopathic; therefore, this HPI is obtained from chart review. Clarence Brooks is a 65 y.o. male with PMH as outlined below including HTN, HLD, DM2, obesity.  He was brought to [facility] ED 03/22 via EMS after a witnessed out of hospital VF arrest with EKG revealing anterolateral STEMI.  CPR was apparently immediately started on the scene and on EMS arrival, he was given 2 rounds of epi as well as 1 defibrillation prior to ROSC.  He had king airway placed in the field and this was switched to ETT in ED.  On arrival to ED, he was met by cardiology team who took him for emergent cardiac cath. Per RN report, pt had severe 3 vessel disease that was not amenable to PCI.  He had IABP placed for EF of roughly 25% and will need CVTS input.  Following cath, he returned to the ICU and hypothermia protocol was initiated.  SUBJECTIVE:  On vent, unresponsive, sedated and paralyzed.  Sz on EEG.  VITAL SIGNS: BP 103/67 mmHg  Pulse 74  Temp(Src) 95 F (35 C) (Core (Comment))  Resp 22  Ht 6' (1.829 m)  Wt 93.2 kg (205 lb 7.5 oz)  BMI 27.86 kg/m2  SpO2 100%  HEMODYNAMICS: CVP:  [5 mmHg-9 mmHg] 9 mmHg  VENTILATOR SETTINGS: Vent Mode:  [-] PRVC FiO2 (%):  [50 %] 50 % Set Rate:  [18 bmp] 18 bmp Vt Set:  [620 mL] 620 mL PEEP:  [5 cmH20] 5 cmH20 Plateau Pressure:  [18 cmH20-22 cmH20] 21 cmH20  INTAKE / OUTPUT: I/O last 3 completed shifts: In: 4621.6 [I.V.:2689.9; IV Piggyback:1931.7] Out: 5189 [Urine:5189]  PHYSICAL EXAMINATION: General: Adult AA male, resting in bed, critically ill.  Occasional twitching. Neuro: Sedated and paralyzed (not fully), does not follow commands. HEENT: [place]/AT. PERRL, sclerae anicteric. Cardiovascular: RRR, no M/R/G.  Lungs:  Respirations even and unlabored.  CTA bilaterally, No W/R/R. Abdomen: BS x 4, soft, NT/ND.  Musculoskeletal: No gross deformities, no edema.  Skin: Intact, warm, no rashes.  LABS:  BMET  Recent Labs Lab 10/11/15 1105 10/11/15 2135 10/12/15 0450  NA 141 140 141  K 2.8* 3.9 3.6  CL 112* 113* 115*  CO2 19* 18* 18*  BUN 12 8 10   CREATININE 0.87 0.76 0.76  GLUCOSE 179* 156* 157*    Electrolytes  Recent Labs Lab 10/11/15 0400 10/11/15 1105 10/11/15 2135 10/12/15 0450  CALCIUM 8.6*  8.6* 8.4* 8.4* 8.2*  MG 2.1 1.9  --  1.8  PHOS 1.6*  --   --  3.5    CBC  Recent Labs Lab 10/15/2015 1928  10/11/15 0400 10/11/15 0420 10/12/15 0450  WBC 8.9  --  7.8  --  6.6  HGB 13.1  < > 12.6* 13.6 12.0*  HCT 41.1  < > 38.4* 40.0 37.3*  PLT 192  --  173  --  167  < > = values in this interval not displayed.  Coag's  Recent Labs Lab 10/05/2015 2200 10/11/15 0400  APTT 58* 62*  INR 1.20 1.18    Sepsis Markers  Recent Labs Lab 10/16/2015 1936 09/29/2015 2243 10/11/15 0500  LATICACIDVEN 7.67* 2.2* 2.5*    ABG  Recent Labs Lab 10/11/2015 2205 10/11/15 0335 10/12/15 0416  PHART 7.394 7.468*  7.429  PCO2ART 32.7* 24.2* 27.0*  PO2ART 401.0* 155* 82.7    Liver Enzymes  Recent Labs Lab 10/12/2015 1928  AST 150*  ALT 166*  ALKPHOS 72  BILITOT 0.8  ALBUMIN 3.4*    Cardiac Enzymes  Recent Labs Lab 10/11/15 0400 10/11/15 1105 10/11/15 1600  TROPONINI 0.29* 0.34* 0.38*    Glucose  Recent Labs Lab 10/11/15 1958 10/11/15 2059 10/11/15 2153 10/12/15 0017 10/12/15 0341 10/12/15 0756  GLUCAP 137* 137* 141* 139* 133* 154*    Imaging Dg Chest Port 1 View  10/12/2015  CLINICAL DATA:  Check endotracheal tube placement EXAM: PORTABLE CHEST 1 VIEW COMPARISON:  10/09/2015 FINDINGS: Cardiac shadow is stable. An endotracheal tube is again seen approximately 5 cm above the carina. A right jugular central line and nasogastric catheter are again noted and stable. New  increased density is noted in the right upper lobe consistent with consolidation likely related mucous plugging given the acuteness of the abnormality. Right-sided pleural effusion is noted. The left lung remains clear. IMPRESSION: New right upper lobe consolidation likely related to mucous plugging. Electronically Signed   By: Inez Catalina M.D.   On: 10/12/2015 07:19   STUDIES:  CXR 03/22 > no acute process.  CULTURES: None.  ANTIBIOTICS: None.  SIGNIFICANT EVENTS: 03/22 > admitted after out of hospital VF arrest.    LINES/TUBES: ETT 03/22 > CVL pending 03/22 > A line pending 03/22 >  DISCUSSION: 65 y.o. M admitted 03/22 after out of hospital VF arrest.  He was taken to cath lab for emergent cardiac cath and per RN, this revealed severe 3 vessel disease not amenable to PCI.  He had EF of 25% so IABP was placed. Following procedure, he returned to ICU where hypothermia protocol was initiated.  ASSESSMENT / PLAN:  CARDIOVASCULAR A:  Out of hospital VF arrest - s/p cardiac cath with reported severe 3 vessel disease not amenable to PCI.  He had EF of 25% so IABP was placed. sCHF - reported EF of ~25% during cardiac cath - official report pending. Hx HTN, HLD. P:  Continue hypothermia protocol, goal temp 33C.  Rewarming today. Goal MAP > 80 during hypothermia protocol. Levophed as needed for above goal. Trend troponins / lactate. Assess echo. Cardiology following. Will need CVTS consult for CABG evaluation. Add neo for BP support given propofol use.  NEUROLOGIC A:   Acute metabolic encephalopathy. Hx anxiety. P:   Sedation:  Cisatracurium gtt / Fentanyl gtt / Midazolam gtt. RASS goal: -5 during hypothermia protocol. Hold daily WUA while under paralysis. EEG with seizure. Neuro consult once rewarmed. Add propofol for sz control.  PULMONARY A: VDRF - due to respiratory insufficiency in the setting of cardiac arrest. P:   Full vent support. Wean as able. VAP  prevention measures. Hold SBT until off paralytics. Albuterol PRN. CXR in AM.  RENAL A:   Hypokalemia - anticipate worsening during hypothermia protocol. Hypocalcemia. AGMA - lactate. P:   BMP q2hrs x 4.  Replace electrolytes as indicated. NS @ 100. BMP in AM.  GASTROINTESTINAL A:   GI prophylaxis. Nutrition. P:  SUP: Pantoprazole. NPO until no longer paralyzed.  HEMATOLOGIC A:   VTE Prophylaxis. P:  SCD's / heparin. Coags q8hrs x 2. CBC in AM.  INFECTIOUS A:   No indication of infection. P:   Monitor clinically.  ENDOCRINE A:   DM  2. P:   ICU hyperglycemia protocol. TSH WNL.  Family updated: No family bedside this AM.  Interdisciplinary Family Meeting  v Palliative Care Meeting:  Due by: 03/28.  The patient is critically ill with multiple organ systems failure and requires high complexity decision making for assessment and support, frequent evaluation and titration of therapies, application of advanced monitoring technologies and extensive interpretation of multiple databases.   Critical Care Time devoted to patient care services described in this note is  35  Minutes. This time reflects time of care of this signee Dr Jennet Maduro. This critical care time does not reflect procedure time, or teaching time or supervisory time of PA/NP/Med student/Med Resident etc but could involve care discussion time.  Rush Farmer, M.D. [facility] Pulmonary/Critical Care Medicine. Pager: [phone]. After hours pager: [phone].  10/12/2015, 9:26 AM

## 2015-10-12 NOTE — Progress Notes (Signed)
ANTICOAGULATION CONSULT NOTE - Follow-up Consult  Pharmacy Consult for Heparin  Indication: IABP  Allergies  Allergen Reactions  . Ace Inhibitors Swelling  . Lisinopril     Throat swelling     Patient Measurements: Height: 6' (182.9 cm) Weight: 205 lb 7.5 oz (93.2 kg) IBW/kg (Calculated) : 77.6   Vital Signs: Temp: 97.7 F (36.5 C) (03/24 1600) Temp Source: Core (Comment) (03/24 1600) BP: 117/76 mmHg (03/24 1600) Pulse Rate: 66 (03/24 1600)  Labs:  Recent Labs  10/09/2015 1928  10/17/2015 2200  10/11/15 0400 10/11/15 0420 10/11/15 1105 10/11/15 1600 10/11/15 2135 10/12/15 0450 10/12/15 1030 10/12/15 1155  HGB 13.1  < >  --   < > 12.6* 13.6  --   --   --  12.0*  --   --   HCT 41.1  < >  --   < > 38.4* 40.0  --   --   --  37.3*  --   --   PLT 192  --   --   --  173  --   --   --   --  167  --   --   APTT  --   --  58*  --  62*  --   --   --   --   --   --   --   LABPROT  --   --  15.4*  --  15.1  --   --   --   --   --   --   --   INR  --   --  1.20  --  1.18  --   --   --   --   --   --   --   HEPARINUNFRC  --   --   --   < > 0.37  --  0.39  --   --  0.37  --  0.28*  CREATININE 1.38*  < > 1.19  < > 1.01  1.01 0.90 0.87  --  0.76 0.76 0.91  --   TROPONINI  --   < > 0.07*  --  0.29*  --  0.34* 0.38*  --   --   --   --   < > = values in this interval not displayed.  Estimated Creatinine Clearance: 98.5 mL/min (by C-G formula based on Cr of 0.91).   Assessment: Clarence Brooks admitted 10/19/2015 with STEMI. S/p cardiac arrest. Pt received CPR and 2 episodes w/ ROSC. Started on hypothermic protocol. S/p PCI and IABP inserted 3/22. Pharmacy consulted to dose heparin for ACS/IABP. Heparin level 0.28 remains therapeutic on 900 units/hr.  However, has fallen since rewarmed and would expect to continue to fall. Now that patient has rewarmed.  Will increase rate slightly as some oozing from IV site early this am  CBC stable. RN started rewarming pt ~0100 this morning - should be  rewarmed by 1300 today.  Goal of Therapy:  HL 0.2-0.5 Monitor platelets by anticoagulation protocol: Yes   Plan:  Continue heparin 1000 units/hr Will check heparin level in 6hr Daily CBC, HL  Leota SauersLisa Addalee Kavanagh Pharm.D. CPP, BCPS Clinical Pharmacist [phone] 10/12/2015 5:01 PM

## 2015-10-12 NOTE — Procedures (Addendum)
Electroencephalogram report- LTM  Ordering Physician : Dr. Otelia LimesLindzen EEG number: [id number]     Day of Study: 5  Beginning date and time: 10/15/2015 7:30AM Ending date and time:  10/16/2015 7:30AM   REPORT: The EEG continues to show burst suppression pattern with1-2 seconds of suppression with 3-5 seconds of bursts. The bursts consist of 1-3Hz  generalized periodic spikes.   CLINICAL CORRELATION: The EEG continues to show Burst Suppression pattern and Generalized Periodic discharges (GPEDS) during the bursts. This can be a malignant pattern in the setting of anoxic brain injury that carries poor prognosis. The suppression is decreased from previous days recording.  ----------------------------------------------------------------------------------------------------------------------------------------------- Day of Study: 4  Beginning date and time: 10/14/2015 7:30AM Ending date and time:  10/15/2015 7:30AM   REPORT: The EEG continues to show burst suppression pattern with 5-7 seconds of suppression with 1-2 seconds of bursts. The bursts consist of generalized periodic spikes.   CLINICAL CORRELATION: There suppression is increased as compared to the previous day's recording, consistent with deep coma that is non-specific and possibly secondary to increase in sedating medications.   --------------------------------------------------------------------------------------------------------------------- Day of Study: 3  Beginning date and time: 10/13/2015 7:30AM Ending date and time:  10/14/2015 7:30AM   REPORT: The EEG continues to show burst suppression pattern with 3-5 seconds of suppression with 1-2 seconds of bursts. The bursts consist of generalized periodic spikes.   CLINICAL CORRELATION: There is increased suppression as compared to the previous day's recording, likely secondary to increase in sedating  medications.   --------------------------------------------------------------------------------------------------------------------- Day of Study: 2  Beginning date and time: 10/12/2015 8AM Ending date and time:  10/13/2015 7:30AM   The EEG continues to show burst suppression pattern with 1-3 seconds of suppression with 3-5 seconds of bursts. The bursts consist of generalized periodic spikes of 3-5Hz  in frequency.  Clinical Correlation:Burst suppression pattern with generalized periodic discharges can be associated with poor prognosis depending on the clinical context.        -------------------------------------------------------------------------------------------------------------------------------------------------  Beginning date and time: 10/11/2015 10:37AM Ending date and time:  10/12/2015 8AM  Day of study: 1  Medications include: Versed, Propofol  MENTAL STATUS (per technician's notes): Intubated. Sedated. Unresponsive.  HISTORY: This 24 hours of intensive EEG monitoring with simultaneous video monitoring was performed for this patient with unresponsiveness and cardiac arrest.  Patient is now intubated and sedated.  This EEG was requested to rule out subclinical electrographic seizures.  TECHNICAL DESCRIPTION:  The study consists of a continuous 16-channel multi-montage digital video EEG recording with twenty-one electrodes placed according to the International 10-20 System. Additional leads included eye leads, true temporal leads (T1, T2), and an EKG lead. Activation procedures were not done due to mental status.  REPORT: The background activity consists of burst-suppression pattern. The bursts consist of 1.5Hz -4Hz  per second generalized periodic discharges (GPEDS), with 1-3 seconds of suppression. These discharges frequently occurred in prolonged bursts, especially towards the later part of the recording. At times the bursts have evolution along with polyspikes suggestive of  brief electrographic seizures.  There were no pushbutton activations events during this recording. Per the report from the technician, patient had myoclonic jerks though not clearly visible on the video.  INTERPRETATION: This is an abnormal EEG due to: 1) Burst suppression pattern with occasional electrographic seizures within bursts  Clinical Correlation: This 24 hours of continuous EEG monitoring with simultaneous video monitoring preformed  for this patient who is intubated and sedated c/w burst/suppression pattern with electrographic seizures within bursts. This pattern is usually associated with poor  prognosis after cardiac arrest. Clinical correlation is required.

## 2015-10-12 NOTE — Progress Notes (Signed)
ANTICOAGULATION CONSULT NOTE - Follow-up Consult  Pharmacy Consult for Heparin  Indication: IABP  Allergies  Allergen Reactions  . Ace Inhibitors Swelling  . Lisinopril     Throat swelling     Patient Measurements: Height: 6' (182.9 cm) Weight: 205 lb 7.5 oz (93.2 kg) IBW/kg (Calculated) : 77.6   Vital Signs: Temp: 93 F (33.9 C) (03/24 0600) Temp Source: Core (Comment) (03/24 0600) BP: 111/72 mmHg (03/24 0600) Pulse Rate: 58 (03/24 0600)  Labs:  Recent Labs  10/02/2015 1928  09/20/2015 2200  10/11/15 0400 10/11/15 0420 10/11/15 1105 10/11/15 1600 10/11/15 2135 10/12/15 0450  HGB 13.1  < >  --   < > 12.6* 13.6  --   --   --  12.0*  HCT 41.1  < >  --   < > 38.4* 40.0  --   --   --  37.3*  PLT 192  --   --   --  173  --   --   --   --  167  APTT  --   --  58*  --  62*  --   --   --   --   --   LABPROT  --   --  15.4*  --  15.1  --   --   --   --   --   INR  --   --  1.20  --  1.18  --   --   --   --   --   HEPARINUNFRC  --   --   --   --  0.37  --  0.39  --   --  0.37  CREATININE 1.38*  < > 1.19  < > 1.01  1.01 0.90 0.87  --  0.76  --   TROPONINI  --   < > 0.07*  --  0.29*  --  0.34* 0.38*  --   --   < > = values in this interval not displayed.  Estimated Creatinine Clearance: 112 mL/min (by C-G formula based on Cr of 0.76).   Assessment: [age] yom admitted 09/26/2015 with STEMI. S/p cardiac arrest. Pt received CPR and 2 episodes w/ ROSC. Started on hypothermic protocol. S/p PCI and IABP inserted 3/22. Pharmacy consulted to dose heparin for ACS/IABP. Heparin level remains therapeutic on 900 units/hr. CBC stable. No bleeding noted. RN started rewarming pt ~0100 this morning - should be rewarmed by 1300 today.  Goal of Therapy:  HL 0.2-0.5 Monitor platelets by anticoagulation protocol: Yes   Plan:  Continue heparin 900 units/hr Will check heparin level at noon to verify that pt remains therapeutic during rewarming  Christoper Fabianaron Idrees Quam, PharmD, BCPS Clinical pharmacist,  pager [phone]  10/12/2015 6:30 AM

## 2015-10-12 NOTE — Clinical Documentation Improvement (Signed)
Critical Care  Can the diagnosis of Respiratory Failure be further specified?   Document Inclusion Of - Hypoxia, Hypercapnia, Combination of Both  Other  Clinically Undetermined  Document any associated diagnoses/conditions.   Supporting Information: Acute respiratory failure-vent, per 3/23 progress notes. VDRF - due to respiratory insufficiency in the setting of cardiac arrest per 3/24 progress notes.   Please exercise your independent, professional judgment when responding. A specific answer is not anticipated or expected.   Thank Sabino DonovanYou,  Kamsiyochukwu Buist [facility] [place] [phone]

## 2015-10-12 NOTE — Progress Notes (Signed)
Pt's wife Zenovia JarredBobbie Winchel given pt's silver colored ring.

## 2015-10-12 NOTE — Progress Notes (Signed)
Subjective: Intubated  Sedated   Objective: Filed Vitals:   10/12/15 0500 10/12/15 0600 10/12/15 0700 10/12/15 0722  BP:  111/72  107/62  Pulse: 61 58 61 68  Temp: 93.6 F (34.2 C) 93 F (33.9 C) 92.5 F (33.6 C)   TempSrc: Core (Comment) Core (Comment) Core (Comment)   Resp: 18 16 18 18   Height:      Weight:      SpO2: 99% 98% 98% 97%   Weight change:   Intake/Output Summary (Last 24 hours) at 10/12/15 0759 Last data filed at 10/12/15 0700  Gross per 24 hour  Intake 3738.69 ml  Output   2189 ml  Net 1549.69 ml    General: INtubated  Sedated   Neck:  Difficult to assess JVP   Heart: Regular rate and rhythm, without murmurs, rubs, gallops.  Lungs: Clear to auscultation.  No rales or wheezes. Exemities:  No edema.    Tele:  SR     Lab Results: Results for orders placed or performed during the hospital encounter of 11-11-15 (from the past 24 hour(s))  Glucose, capillary     Status: Abnormal   Collection Time: 10/11/15  8:09 AM  Result Value Ref Range   Glucose-Capillary 139 (H) 65 - 99 mg/dL   Comment 1 Arterial Specimen   Glucose, capillary     Status: Abnormal   Collection Time: 10/11/15  9:01 AM  Result Value Ref Range   Glucose-Capillary 143 (H) 65 - 99 mg/dL   Comment 1 Arterial Specimen   Glucose, capillary     Status: Abnormal   Collection Time: 10/11/15 10:00 AM  Result Value Ref Range   Glucose-Capillary 155 (H) 65 - 99 mg/dL  Glucose, capillary     Status: Abnormal   Collection Time: 10/11/15 11:04 AM  Result Value Ref Range   Glucose-Capillary 175 (H) 65 - 99 mg/dL  Troponin I     Status: Abnormal   Collection Time: 10/11/15 11:05 AM  Result Value Ref Range   Troponin I 0.34 (H) <0.031 ng/mL  Heparin level (unfractionated)     Status: None   Collection Time: 10/11/15 11:05 AM  Result Value Ref Range   Heparin Unfractionated 0.39 0.30 - 0.70 IU/mL  Basic metabolic panel     Status: Abnormal   Collection Time: 10/11/15 11:05 AM  Result Value  Ref Range   Sodium 141 135 - 145 mmol/L   Potassium 2.8 (L) 3.5 - 5.1 mmol/L   Chloride 112 (H) 101 - 111 mmol/L   CO2 19 (L) 22 - 32 mmol/L   Glucose, Bld 179 (H) 65 - 99 mg/dL   BUN 12 6 - 20 mg/dL   Creatinine, Ser 1.610.87 0.61 - 1.24 mg/dL   Calcium 8.4 (L) 8.9 - 10.3 mg/dL   GFR calc non Af Amer >60 >60 mL/min   GFR calc Af Amer >60 >60 mL/min   Anion gap 10 5 - 15  Magnesium     Status: None   Collection Time: 10/11/15 11:05 AM  Result Value Ref Range   Magnesium 1.9 1.7 - 2.4 mg/dL  Glucose, capillary     Status: Abnormal   Collection Time: 10/11/15 11:59 AM  Result Value Ref Range   Glucose-Capillary 184 (H) 65 - 99 mg/dL  Glucose, capillary     Status: Abnormal   Collection Time: 10/11/15  1:05 PM  Result Value Ref Range   Glucose-Capillary 180 (H) 65 - 99 mg/dL  Glucose, capillary  Status: Abnormal   Collection Time: 10/11/15  2:01 PM  Result Value Ref Range   Glucose-Capillary 179 (H) 65 - 99 mg/dL   Comment 1 Arterial Specimen   Glucose, capillary     Status: Abnormal   Collection Time: 10/11/15  2:57 PM  Result Value Ref Range   Glucose-Capillary 181 (H) 65 - 99 mg/dL  Troponin I     Status: Abnormal   Collection Time: 10/11/15  4:00 PM  Result Value Ref Range   Troponin I 0.38 (H) <0.031 ng/mL  Glucose, capillary     Status: Abnormal   Collection Time: 10/11/15  4:02 PM  Result Value Ref Range   Glucose-Capillary 127 (H) 65 - 99 mg/dL   Comment 1 Arterial Specimen   Glucose, capillary     Status: Abnormal   Collection Time: 10/11/15  5:14 PM  Result Value Ref Range   Glucose-Capillary 114 (H) 65 - 99 mg/dL  Glucose, capillary     Status: None   Collection Time: 10/11/15  6:13 PM  Result Value Ref Range   Glucose-Capillary 93 65 - 99 mg/dL  Glucose, capillary     Status: Abnormal   Collection Time: 10/11/15  7:06 PM  Result Value Ref Range   Glucose-Capillary 125 (H) 65 - 99 mg/dL  Glucose, capillary     Status: Abnormal   Collection Time: 10/11/15   7:58 PM  Result Value Ref Range   Glucose-Capillary 137 (H) 65 - 99 mg/dL  Glucose, capillary     Status: Abnormal   Collection Time: 10/11/15  8:59 PM  Result Value Ref Range   Glucose-Capillary 137 (H) 65 - 99 mg/dL  Basic metabolic panel     Status: Abnormal   Collection Time: 10/11/15  9:35 PM  Result Value Ref Range   Sodium 140 135 - 145 mmol/L   Potassium 3.9 3.5 - 5.1 mmol/L   Chloride 113 (H) 101 - 111 mmol/L   CO2 18 (L) 22 - 32 mmol/L   Glucose, Bld 156 (H) 65 - 99 mg/dL   BUN 8 6 - 20 mg/dL   Creatinine, Ser 1.61 0.61 - 1.24 mg/dL   Calcium 8.4 (L) 8.9 - 10.3 mg/dL   GFR calc non Af Amer >60 >60 mL/min   GFR calc Af Amer >60 >60 mL/min   Anion gap 9 5 - 15  Glucose, capillary     Status: Abnormal   Collection Time: 10/11/15  9:53 PM  Result Value Ref Range   Glucose-Capillary 141 (H) 65 - 99 mg/dL  Glucose, capillary     Status: Abnormal   Collection Time: 10/12/15 12:17 AM  Result Value Ref Range   Glucose-Capillary 139 (H) 65 - 99 mg/dL  Glucose, capillary     Status: Abnormal   Collection Time: 10/12/15  3:41 AM  Result Value Ref Range   Glucose-Capillary 133 (H) 65 - 99 mg/dL  Blood gas, arterial     Status: Abnormal   Collection Time: 10/12/15  4:16 AM  Result Value Ref Range   FIO2 0.50    Delivery systems VENTILATOR    Mode PRESSURE REGULATED VOLUME CONTROL    VT 620 mL   LHR 18 resp/min   Peep/cpap 5.0 cm H20   pH, Arterial 7.429 7.350 - 7.450   pCO2 arterial 27.0 (L) 35.0 - 45.0 mmHg   pO2, Arterial 82.7 80.0 - 100.0 mmHg   Bicarbonate 18.3 (L) 20.0 - 24.0 mEq/L   TCO2 19.2 0 - 100 mmol/L  Acid-base deficit 5.8 (H) 0.0 - 2.0 mmol/L   O2 Saturation 96.9 %   Patient temperature 93.2    Collection site A-LINE    Drawn by [phone]    Sample type ARTERIAL DRAW   Heparin level (unfractionated)     Status: None   Collection Time: 10/12/15  4:50 AM  Result Value Ref Range   Heparin Unfractionated 0.37 0.30 - 0.70 IU/mL  CBC     Status: Abnormal    Collection Time: 10/12/15  4:50 AM  Result Value Ref Range   WBC 6.6 4.0 - 10.5 K/uL   RBC 4.10 (L) 4.22 - 5.81 MIL/uL   Hemoglobin 12.0 (L) 13.0 - 17.0 g/dL   HCT 60.4 (L) 54.0 - 98.1 %   MCV 91.0 78.0 - 100.0 fL   MCH 29.3 26.0 - 34.0 pg   MCHC 32.2 30.0 - 36.0 g/dL   RDW 19.1 47.8 - 29.5 %   Platelets 167 150 - 400 K/uL  Basic metabolic panel     Status: Abnormal   Collection Time: 10/12/15  4:50 AM  Result Value Ref Range   Sodium 141 135 - 145 mmol/L   Potassium 3.6 3.5 - 5.1 mmol/L   Chloride 115 (H) 101 - 111 mmol/L   CO2 18 (L) 22 - 32 mmol/L   Glucose, Bld 157 (H) 65 - 99 mg/dL   BUN 10 6 - 20 mg/dL   Creatinine, Ser 6.21 0.61 - 1.24 mg/dL   Calcium 8.2 (L) 8.9 - 10.3 mg/dL   GFR calc non Af Amer >60 >60 mL/min   GFR calc Af Amer >60 >60 mL/min   Anion gap 8 5 - 15  Magnesium     Status: None   Collection Time: 10/12/15  4:50 AM  Result Value Ref Range   Magnesium 1.8 1.7 - 2.4 mg/dL  Phosphorus     Status: None   Collection Time: 10/12/15  4:50 AM  Result Value Ref Range   Phosphorus 3.5 2.5 - 4.6 mg/dL    Studies/Results: Dg Chest Port 1 View  10/12/2015  CLINICAL DATA:  Check endotracheal tube placement EXAM: PORTABLE CHEST 1 VIEW COMPARISON:  10/08/2015 FINDINGS: Cardiac shadow is stable. An endotracheal tube is again seen approximately 5 cm above the carina. A right jugular central line and nasogastric catheter are again noted and stable. New increased density is noted in the right upper lobe consistent with consolidation likely related mucous plugging given the acuteness of the abnormality. Right-sided pleural effusion is noted. The left lung remains clear. IMPRESSION: New right upper lobe consolidation likely related to mucous plugging. Electronically Signed   By: Alcide Clever M.D.   On: 10/12/2015 07:19    Medications:REviewed    @  1  VF arrest  Still in cooling protocol  No further arrhythmias  Being warmed now.    2.  CAD  Diffuse severe CAD     3  Neuro  EEG being done  Pt with seizure activity    CCM is organizing care. Further cardiac work up/intervention will depend on neuro recovery. Px is very guarded.      LOS: 2 days   Dietrich Pates 10/12/2015, 7:59 AM

## 2015-10-12 NOTE — Progress Notes (Addendum)
Subjective: Continues to be on sedation and paralytic. Has developed twitching overnight.   Exam: Filed Vitals:   10/12/15 0900 10/12/15 1000  BP:    Pulse: 74   Temp: 95 F (35 C) 95.5 F (35.3 C)  Resp: 22     Neurological exam: Gen: Intubated and sedated MS: Unresponsive to verbal, tactile and noxious stimuli. On Versed at 20 mg/Hr and paralyzed with Nimbex at time of exam. CN: pupils 2 mm non-reactive, no dolls, no blink to threat. Shivering and low amplitude motor twitching of face and neck bilaterally  Motor: Flaccid tone throughout, with no movement to noxious (on Nimbex) Sensory: No response to pain (on Nimbex)  Pertinent Labs: EEG shows intermittent GPEDs interspersed with suppression of electrocerebral activity in a burst suppression epileptiform pattern.   Impression/Recommendations:  1. Status epilepticus S/P cardiac arrest. Currently on 20 mg Versed drip per hour. Keppra now at 1500 BID. Have started Propofol drip after a 80 MG bolus.  2. 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 Flatline EEG between bursts implies a poor prognosis. Will continue to monitor with 24 hour EEG.  3. IV Depakote is being added at 15 mg/kg/day divided into 3 doses, which may reduce or eliminate epileptiform bursts on EEG, but also may have no effect given probable extensive cortical damage from anoxic brain injury.  4. Draw valproic acid level in the AM. Follow LFTs.   Caryl PinaEric Calli Bashor, MD 10/12/2015, 10:08 AM

## 2015-10-12 NOTE — Progress Notes (Signed)
Initial Nutrition Assessment  DOCUMENTATION CODES:   Not applicable  INTERVENTION:   - If unable to extubate, recommend initiating enteral nutrition with Vital High Protein at 20 ml/hr to increase by 10 ml every 4 hours to a goal rate of 60 ml/hr.  Provide 30 ml Prostat once per day.  Tube feeding regimen and Propofol at current rate will provide a total of 1688 kcals, 141 grams of protein, and 1204 ml of water.  NUTRITION DIAGNOSIS:   Inadequate oral intake related to inability to eat as evidenced by NPO status.  GOAL:   Patient will meet greater than or equal to 90% of their needs  MONITOR:   Vent status, Labs, Weight trends, Skin, I & O's  REASON FOR ASSESSMENT:   Ventilator    ASSESSMENT:   65 y.o. male with PMH as outlined below including HTN, HLD, DM2, obesity. [facility] after a witnessed out of hospital VF arrest with EKG revealing anterolateral STEMI.   Patient currently intubated on ventilator support. MV: 10.4 ml/min Temp (24hrs), Avg:92.1 F (33.4 C), Min:89.1 F (31.7 C), Max:95.5 F (35.3 C) OG tube Propofol: 5.6 ml/hr, providing 148 kcals from lipid  Nutrition Focused Physical Exam completed.  Findings include no fat depletion, no muscle depletion, and no edema.  Pt on [facility] Protocol, slowly rewarming at time of visit.  RN reports that patient was having seizure like activity so propofol was just begun this morning.   Medications reviewed: novolog, lantus, protonix.  Labs reviewed: CBGs (133-154).  Diet Order:    NPO  Skin:  Reviewed, no issues  Last BM:  Unknown  Height:   Ht Readings from Last 1 Encounters:  09/22/2015 6' (1.829 m)    Weight:   Wt Readings from Last 1 Encounters:  10/16/2015 205 lb 7.5 oz (93.2 kg)    Ideal Body Weight:  80.9 kg  BMI:  Body mass index is 27.86 kg/(m^2).  Estimated Nutritional Needs:   Kcal:  1654  Protein:  >/= 140 grams  Fluid:  >/= 1.6 L  EDUCATION NEEDS:   No  education needs identified at this time  Doroteo Glassmanoleman Kalayah Leske, Dietetic Intern Pager: [phone]

## 2015-10-13 ENCOUNTER — Inpatient Hospital Stay (HOSPITAL_COMMUNITY): Payer: Medicare Other

## 2015-10-13 DIAGNOSIS — G40901 Epilepsy, unspecified, not intractable, with status epilepticus: Secondary | ICD-10-CM | POA: Insufficient documentation

## 2015-10-13 LAB — BLOOD GAS, ARTERIAL
ACID-BASE DEFICIT: 5.6 mmol/L — AB (ref 0.0–2.0)
BICARBONATE: 18.6 meq/L — AB (ref 20.0–24.0)
Drawn by: 42624
FIO2: 0.4
LHR: 18 {breaths}/min
O2 SAT: 96.8 %
PEEP: 5 cmH2O
PH ART: 7.377 (ref 7.350–7.450)
Patient temperature: 98.6
TCO2: 19.6 mmol/L (ref 0–100)
VT: 620 mL
pCO2 arterial: 32.5 mmHg — ABNORMAL LOW (ref 35.0–45.0)
pO2, Arterial: 95.1 mmHg (ref 80.0–100.0)

## 2015-10-13 LAB — HEPATIC FUNCTION PANEL
ALT: 106 U/L — ABNORMAL HIGH (ref 17–63)
AST: 48 U/L — ABNORMAL HIGH (ref 15–41)
Albumin: 2.2 g/dL — ABNORMAL LOW (ref 3.5–5.0)
Alkaline Phosphatase: 77 U/L (ref 38–126)
BILIRUBIN DIRECT: 0.2 mg/dL (ref 0.1–0.5)
BILIRUBIN TOTAL: 0.5 mg/dL (ref 0.3–1.2)
Indirect Bilirubin: 0.3 mg/dL (ref 0.3–0.9)
Total Protein: 5.4 g/dL — ABNORMAL LOW (ref 6.5–8.1)

## 2015-10-13 LAB — BASIC METABOLIC PANEL
ANION GAP: 8 (ref 5–15)
BUN: 9 mg/dL (ref 6–20)
CHLORIDE: 117 mmol/L — AB (ref 101–111)
CO2: 19 mmol/L — AB (ref 22–32)
Calcium: 8.2 mg/dL — ABNORMAL LOW (ref 8.9–10.3)
Creatinine, Ser: 1.19 mg/dL (ref 0.61–1.24)
GFR calc Af Amer: >60 mL/min (ref >60)
GLUCOSE: 128 mg/dL — AB (ref 65–99)
POTASSIUM: 3.6 mmol/L (ref 3.5–5.1)
SODIUM: 144 mmol/L (ref 135–145)

## 2015-10-13 LAB — CBC
HCT: 36.5 % — ABNORMAL LOW (ref 39.0–52.0)
Hemoglobin: 12 g/dL — ABNORMAL LOW (ref 13.0–17.0)
MCH: 30.7 pg (ref 26.0–34.0)
MCHC: 32.9 g/dL (ref 30.0–36.0)
MCV: 93.4 fL (ref 78.0–100.0)
PLATELETS: 168 10*3/uL (ref 150–400)
RBC: 3.91 MIL/uL — AB (ref 4.22–5.81)
RDW: 13.7 % (ref 11.5–15.5)
WBC: 7.9 10*3/uL (ref 4.0–10.5)

## 2015-10-13 LAB — GLUCOSE, CAPILLARY
GLUCOSE-CAPILLARY: 102 mg/dL — AB (ref 65–99)
GLUCOSE-CAPILLARY: 106 mg/dL — AB (ref 65–99)
GLUCOSE-CAPILLARY: 107 mg/dL — AB (ref 65–99)
GLUCOSE-CAPILLARY: 131 mg/dL — AB (ref 65–99)
Glucose-Capillary: 115 mg/dL — ABNORMAL HIGH (ref 65–99)
Glucose-Capillary: 136 mg/dL — ABNORMAL HIGH (ref 65–99)

## 2015-10-13 LAB — HEPARIN LEVEL (UNFRACTIONATED)
HEPARIN UNFRACTIONATED: 0.27 [IU]/mL — AB (ref 0.30–0.70)
Heparin Unfractionated: 0.22 IU/mL — ABNORMAL LOW (ref 0.30–0.70)

## 2015-10-13 LAB — VALPROIC ACID LEVEL: VALPROIC ACID LVL: 41 ug/mL — AB (ref 50.0–100.0)

## 2015-10-13 LAB — PHOSPHORUS: Phosphorus: 3.8 mg/dL (ref 2.5–4.6)

## 2015-10-13 LAB — MAGNESIUM: Magnesium: 2 mg/dL (ref 1.7–2.4)

## 2015-10-13 LAB — AMMONIA: Ammonia: 34 umol/L (ref 9–35)

## 2015-10-13 MED ORDER — SODIUM CHLORIDE 0.9 % IV SOLN
100.0000 mg/h | INTRAVENOUS | Status: DC
  Administered 2015-10-13 – 2015-10-14 (×5): 65 mg/h via INTRAVENOUS
  Filled 2015-10-13 (×8): qty 40

## 2015-10-13 MED ORDER — SODIUM CHLORIDE 0.9 % IV SOLN
45.0000 mg/h | INTRAVENOUS | Status: DC
  Administered 2015-10-13 (×2): 45 mg/h via INTRAVENOUS
  Filled 2015-10-13 (×3): qty 20

## 2015-10-13 MED ORDER — SODIUM CHLORIDE 0.9 % IV SOLN: INTRAVENOUS | Status: DC

## 2015-10-13 MED ORDER — SODIUM CHLORIDE 0.9 % IV SOLN
65.0000 mg/h | INTRAVENOUS | Status: DC
  Administered 2015-10-13: 65 mg/h via INTRAVENOUS
  Filled 2015-10-13 (×2): qty 20

## 2015-10-13 MED ORDER — VALPROATE SODIUM 500 MG/5ML IV SOLN
500.0000 mg | Freq: Three times a day (TID) | INTRAVENOUS | Status: DC
  Administered 2015-10-13 – 2015-10-15 (×6): 500 mg via INTRAVENOUS
  Filled 2015-10-13 (×8): qty 5

## 2015-10-13 MED ORDER — SODIUM CHLORIDE 0.9 % IV SOLN
INTRAVENOUS | Status: DC
  Administered 2015-10-13 (×2): via INTRAVENOUS
  Administered 2015-10-15: 500 mL via INTRAVENOUS

## 2015-10-13 MED ORDER — PANTOPRAZOLE SODIUM 40 MG PO PACK
40.0000 mg | PACK | Freq: Every day | ORAL | Status: DC
  Administered 2015-10-13 – 2015-10-18 (×6): 40 mg
  Filled 2015-10-13 (×6): qty 20

## 2015-10-13 NOTE — Progress Notes (Signed)
PULMONARY / CRITICAL CARE MEDICINE   Name: Clarence Brooks MRN: [id number] DOB: Oct 06, 1950    ADMISSION DATE:  10/01/2015 CONSULTATION DATE:  09/24/2015  REFERRING MD:  Claiborne Billings  CHIEF COMPLAINT:  Cardiac Arrest  BRIEF Hx:   Clarence Brooks is a 65 y.o. male with PMH as outlined below including HTN, HLD, DM2, obesity.  He was brought to [facility] ED 03/22 via EMS after a witnessed out of hospital VF arrest with EKG revealing anterolateral STEMI.  CPR was apparently immediately started on the scene and on EMS arrival, he was given 2 rounds of epi as well as 1 defibrillation prior to ROSC.  He had king airway placed in the field and this was switched to ETT in ED.  On arrival to ED, he was met by cardiology team who took him for emergent cardiac cath. Per RN report, pt had severe 3 vessel disease that was not amenable to PCI.  He had IABP placed for EF of roughly 25% and will need CVTS input.  Following cath, he returned to the ICU and hypothermia protocol was initiated.  SUBJECTIVE:  Concern for ongoing seizures despite 50 propofol, 400 fent, 30 versed (increased overnight by neurohospitalists).  No acute events.  Reached rewarming ~ 0200  VITAL SIGNS: BP 103/71 mmHg  Pulse 68  Temp(Src) 98.6 F (37 C) (Core (Comment))  Resp 16  Ht 6' (1.829 m)  Wt 205 lb 7.5 oz (93.2 kg)  BMI 27.86 kg/m2  SpO2 99%  HEMODYNAMICS: CVP:  [6 mmHg-12 mmHg] 6 mmHg  VENTILATOR SETTINGS: Vent Mode:  [-] PRVC FiO2 (%):  [40 %-50 %] 40 % Set Rate:  [18 bmp] 18 bmp Vt Set:  [620 mL] 620 mL PEEP:  [5 cmH20] 5 cmH20 Plateau Pressure:  [19 cmH20-21 cmH20] 20 cmH20  INTAKE / OUTPUT: I/O last 3 completed shifts: In: 5634.1 [I.V.:3712.4; IV Piggyback:1921.7] Out: 3172 [Urine:3172]  PHYSICAL EXAMINATION: General: Adult male, resting in bed, critically ill.  Neuro: deep sedation, no response to voice HEENT: [place]/AT. PERRL, sclerae anicteric. Cardiovascular: RRR, no M/R/G.  Lungs: Respirations even and unlabored.  CTA  bilaterally, No W/R/R. Abdomen: BS x 4, soft, NT/ND. Cooling pads in place Musculoskeletal: No gross deformities, no edema.  Skin: Intact, warm, no rashes.  LABS:  BMET  Recent Labs Lab 10/12/15 0450 10/12/15 1030 10/13/15 0356  NA 141 140 144  K 3.6 3.5 3.6  CL 115* 114* 117*  CO2 18* 18* 19*  BUN 10 10 9   CREATININE 0.76 0.91 1.19  GLUCOSE 157* 156* 128*    Electrolytes  Recent Labs Lab 10/11/15 0400 10/11/15 1105  10/12/15 0450 10/12/15 1030 10/13/15 0356  CALCIUM 8.6*  8.6* 8.4*  < > 8.2* 8.2* 8.2*  MG 2.1 1.9  --  1.8  --  2.0  PHOS 1.6*  --   --  3.5  --  3.8  < > = values in this interval not displayed.  CBC  Recent Labs Lab 10/11/15 0400 10/11/15 0420 10/12/15 0450 10/13/15 0356  WBC 7.8  --  6.6 7.9  HGB 12.6* 13.6 12.0* 12.0*  HCT 38.4* 40.0 37.3* 36.5*  PLT 173  --  167 168    Coag's  Recent Labs Lab 10/09/2015 2200 10/11/15 0400  APTT 58* 62*  INR 1.20 1.18    Sepsis Markers  Recent Labs Lab 09/28/2015 1936 10/16/2015 2243 10/11/15 0500  LATICACIDVEN 7.67* 2.2* 2.5*    ABG  Recent Labs Lab 10/11/15 0335 10/12/15 0416 10/13/15 0410  PHART  7.468* 7.429 7.377  PCO2ART 24.2* 27.0* 32.5*  PO2ART 155* 82.7 95.1    Liver Enzymes  Recent Labs Lab 10/09/2015 1928  AST 150*  ALT 166*  ALKPHOS 72  BILITOT 0.8  ALBUMIN 3.4*    Cardiac Enzymes  Recent Labs Lab 10/11/15 0400 10/11/15 1105 10/11/15 1600  TROPONINI 0.29* 0.34* 0.38*    Glucose  Recent Labs Lab 10/12/15 0756 10/12/15 1147 10/12/15 1600 10/12/15 1941 10/12/15 2330 10/13/15 0346  GLUCAP 154* 129* 111* 132* 121* 115*    Imaging No results found.   STUDIES:  CXR 03/22 > no acute process. CXR 3/25 > images personally reviewed, R effusion ECHO 3/23 >> LVEF 20-25%, severe global hypokinesis, mod LVH, diastolic dysfunction, elevated filling pressure EEG 3/24 >> burst suppression pattern with electrographic seizures within bursts, pattern usually  associated with poor outcome   CULTURES: BCx2 3/23 >>  Sputum 3/24 >>  UC 3/23 >>   ANTIBIOTICS: None.  SIGNIFICANT EVENTS: 3/22  Admitted after out of hospital VF arrest.   3/25  Reached re-warming ~ 0200  LINES/TUBES: ETT 03/22 > R IJ CVL 3/22 > R rad A line 3/22 >   DISCUSSION: 65 y.o. M admitted 03/22 after out of hospital VF arrest.  He was taken to cath lab for emergent cardiac cath and  this revealed severe 3 vessel disease not amenable to PCI.  He had EF of 25% so IABP was placed. Following procedure, he returned to ICU where hypothermia protocol was initiated.  Re-warmed ~0200 3/25.    ASSESSMENT / PLAN:  CARDIOVASCULAR A:  Out of hospital VF arrest - s/p cardiac cath with reported severe 3 vessel disease not amenable to PCI.  EF of 25% so IABP was placed sCHF - reported EF of ~25% during cardiac cath - official report pending Severe 3 vessel CAD  Hx HTN, HLD P:  Continue normothermia protocol  Goal MAP > 80 during hypothermia protocol. Levophed as needed for above goal. Trend troponins / lactate. Cardiology following, appreciate input ECHO results as above  Will need CVTS consult for CABG evaluation pending neuro prognostication  NEUROLOGIC A:   Acute metabolic encephalopathy - secondary to cardiac arrest. Concern for Seizure Activity  Hx anxiety P:   RASS goal: -5 with concern for seizures  Hold daily WUA with deep sedation for seizure Neuro critical care review of literature >> benzo rec's 0.2-0.4 mg/kg (pt's 0.4 = 90m/hr).   Neuro goal is for control of burst suppression x 48 hours, then reassess status, increase to 454mhr per Neuro.  Discussed with Dr. LiCheral Marker  EEG as above Neurology following, appreciate input Propofol, keppra, vimpat, valproate, versed for sz control  PULMONARY A: Acute hypercarbic respiratory failure - hypercarbia resolved, due to respiratory insufficiency in the setting of cardiac arrest P:   PRVC, 8 cc/kg Wean PEEP /  FiO2 for sats > 92% VAP prevention measures. Hold SBT given sedation for seizures Albuterol PRN. CXR intermittently   RENAL A:   Hypokalemia - anticipate worsening during hypothermia protocol Hypocalcemia Hyperchloremia - suspect 2/2 to NS administration AGMA - lactate P:   BMP q2hrs x 4 Replace electrolytes as indicated Reduce NS to KVO BMP in AM  GASTROINTESTINAL A:   GI prophylaxis Nutrition P:  SUP: Pantoprazole NPO  Consider TF am 3/26  HEMATOLOGIC A:   Anemia - mild, no evidence of bleeding  VTE Prophylaxis P:  SCD's / heparin Coags q8hrs x 2 Trend CBC  INFECTIOUS A:   No indication of  infection P:   Monitor clinically Cultures as above   ENDOCRINE A:   DM  2 P:   ICU hyperglycemia protocol Lantus 10 units Q24 TSH WNL  Family updated: No family bedside am 3/25.  Interdisciplinary Family Meeting v Palliative Care Meeting:  Due by: 03/28.    Noe Gens, NP-C [facility] Pgr: [phone] or if no answer [phone] 10/13/2015, 7:01 AM

## 2015-10-13 NOTE — Progress Notes (Signed)
EEG shows continued epileptiform bursts interspersed with suppression. Propofol increased to 70 mcg/kg/min.   Caryl PinaEric Richrd Kuzniar, MD

## 2015-10-13 NOTE — Progress Notes (Signed)
Patient ID: Clarence Brooks, male   DOB: Jun 18, 1951, 65 y.o.   MRN: [id number]   Subjective: Intubated  Sedated   Objective: Filed Vitals:   10/13/15 0400 10/13/15 0500 10/13/15 0600 10/13/15 0700  BP: 112/68  103/71   Pulse: 73 70 68 68  Temp: 98.6 F (37 C) 98.8 F (37.1 C) 98.6 F (37 C) 98.6 F (37 C)  TempSrc: Core (Comment) Core (Comment) Core (Comment) Core (Comment)  Resp: 18 18 18 16   Height:      Weight:      SpO2: 98% 100% 99% 99%   Weight change:   Intake/Output Summary (Last 24 hours) at 10/13/15 0740 Last data filed at 10/13/15 0700  Gross per 24 hour  Intake 3751.53 ml  Output   2623 ml  Net 1128.53 ml    General: INtubated  Sedated   Neck:  Difficult to assess JVP   Heart: Regular rate and rhythm, without murmurs, rubs, gallops.  Lungs: Clear to auscultation.  No rales or wheezes. Exemities:  No edema.   Warming: Lines in place including right art line in radial IABP 1:1 with good augmentation  Tele:  SR  No VT  10/13/2015    Lab Results: Results for orders placed or performed during the hospital encounter of 09/21/2015 (from the past 24 hour(s))  Glucose, capillary     Status: Abnormal   Collection Time: 10/12/15  7:56 AM  Result Value Ref Range   Glucose-Capillary 154 (H) 65 - 99 mg/dL  Basic metabolic panel     Status: Abnormal   Collection Time: 10/12/15 10:30 AM  Result Value Ref Range   Sodium 140 135 - 145 mmol/L   Potassium 3.5 3.5 - 5.1 mmol/L   Chloride 114 (H) 101 - 111 mmol/L   CO2 18 (L) 22 - 32 mmol/L   Glucose, Bld 156 (H) 65 - 99 mg/dL   BUN 10 6 - 20 mg/dL   Creatinine, Ser 8.290.91 0.61 - 1.24 mg/dL   Calcium 8.2 (L) 8.9 - 10.3 mg/dL   GFR calc non Af Amer >60 >60 mL/min   GFR calc Af Amer >60 >60 mL/min   Anion gap 8 5 - 15  Glucose, capillary     Status: Abnormal   Collection Time: 10/12/15 11:47 AM  Result Value Ref Range   Glucose-Capillary 129 (H) 65 - 99 mg/dL   Comment 1 Arterial Specimen   Heparin level  (unfractionated)     Status: Abnormal   Collection Time: 10/12/15 11:55 AM  Result Value Ref Range   Heparin Unfractionated 0.28 (L) 0.30 - 0.70 IU/mL  Glucose, capillary     Status: Abnormal   Collection Time: 10/12/15  4:00 PM  Result Value Ref Range   Glucose-Capillary 111 (H) 65 - 99 mg/dL   Comment 1 Arterial Specimen   Glucose, capillary     Status: Abnormal   Collection Time: 10/12/15  7:41 PM  Result Value Ref Range   Glucose-Capillary 132 (H) 65 - 99 mg/dL  Heparin level (unfractionated)     Status: Abnormal   Collection Time: 10/12/15 11:30 PM  Result Value Ref Range   Heparin Unfractionated 0.27 (L) 0.30 - 0.70 IU/mL  Glucose, capillary     Status: Abnormal   Collection Time: 10/12/15 11:30 PM  Result Value Ref Range   Glucose-Capillary 121 (H) 65 - 99 mg/dL  Glucose, capillary     Status: Abnormal   Collection Time: 10/13/15  3:46 AM  Result Value Ref Range  Glucose-Capillary 115 (H) 65 - 99 mg/dL  Heparin level (unfractionated)     Status: Abnormal   Collection Time: 10/13/15  3:50 AM  Result Value Ref Range   Heparin Unfractionated 0.22 (L) 0.30 - 0.70 IU/mL  CBC     Status: Abnormal   Collection Time: 10/13/15  3:56 AM  Result Value Ref Range   WBC 7.9 4.0 - 10.5 K/uL   RBC 3.91 (L) 4.22 - 5.81 MIL/uL   Hemoglobin 12.0 (L) 13.0 - 17.0 g/dL   HCT 69.6 (L) 29.5 - 28.4 %   MCV 93.4 78.0 - 100.0 fL   MCH 30.7 26.0 - 34.0 pg   MCHC 32.9 30.0 - 36.0 g/dL   RDW 13.2 44.0 - 10.2 %   Platelets 168 150 - 400 K/uL  Basic metabolic panel     Status: Abnormal   Collection Time: 10/13/15  3:56 AM  Result Value Ref Range   Sodium 144 135 - 145 mmol/L   Potassium 3.6 3.5 - 5.1 mmol/L   Chloride 117 (H) 101 - 111 mmol/L   CO2 19 (L) 22 - 32 mmol/L   Glucose, Bld 128 (H) 65 - 99 mg/dL   BUN 9 6 - 20 mg/dL   Creatinine, Ser 7.25 0.61 - 1.24 mg/dL   Calcium 8.2 (L) 8.9 - 10.3 mg/dL   GFR calc non Af Amer >60 >60 mL/min   GFR calc Af Amer >60 >60 mL/min   Anion gap 8  5 - 15  Magnesium     Status: None   Collection Time: 10/13/15  3:56 AM  Result Value Ref Range   Magnesium 2.0 1.7 - 2.4 mg/dL  Phosphorus     Status: None   Collection Time: 10/13/15  3:56 AM  Result Value Ref Range   Phosphorus 3.8 2.5 - 4.6 mg/dL  Blood gas, arterial     Status: Abnormal   Collection Time: 10/13/15  4:10 AM  Result Value Ref Range   FIO2 0.40    Delivery systems VENTILATOR    Mode PRESSURE REGULATED VOLUME CONTROL    VT 620 mL   LHR 18 resp/min   Peep/cpap 5.0 cm H20   pH, Arterial 7.377 7.350 - 7.450   pCO2 arterial 32.5 (L) 35.0 - 45.0 mmHg   pO2, Arterial 95.1 80.0 - 100.0 mmHg   Bicarbonate 18.6 (L) 20.0 - 24.0 mEq/L   TCO2 19.6 0 - 100 mmol/L   Acid-base deficit 5.6 (H) 0.0 - 2.0 mmol/L   O2 Saturation 96.8 %   Patient temperature 98.6    Collection site A-LINE    Drawn by [id number]    Sample type ARTERIAL DRAW    Allens test (pass/fail) PASS PASS    Current facility-administered medications:  .  0.9 %  sodium chloride infusion, , Intravenous, Continuous, Jeanella Craze, NP .  acetaminophen (TYLENOL) tablet 650 mg, 650 mg, Oral, Q4H PRN, Lennette Bihari, MD .  antiseptic oral rinse solution (CORINZ), 7 mL, Mouth Rinse, 10 times per day, Roslynn Amble, MD, 7 mL at 10/13/15 0600 .  artificial tears (LACRILUBE) ophthalmic ointment 1 application, 1 application, Both Eyes, 3 times per day, Rahul P Desai, PA-C, 1 application at 10/12/15 1413 .  aspirin chewable tablet 81 mg, 81 mg, Oral, Daily, Lennette Bihari, MD, 81 mg at 10/12/15 0953 .  atorvastatin (LIPITOR) tablet 80 mg, 80 mg, Oral, q1800, Lennette Bihari, MD, 80 mg at 10/12/15 1705 .  chlorhexidine gluconate (PERIDEX) 0.12 %  solution 15 mL, 15 mL, Mouth Rinse, BID, Roslynn Amble, MD, 15 mL at 10/12/15 2000 .  dextrose 10 % infusion, , Intravenous, Continuous PRN, Roslynn Amble, MD .  fentaNYL (SUBLIMAZE) 2,500 mcg in sodium chloride 0.9 % 250 mL (10 mcg/mL) infusion, 25-400 mcg/hr,  Intravenous, Continuous, Rahul P Desai, PA-C, Last Rate: 40 mL/hr at 10/13/15 0109, 400 mcg/hr at 10/13/15 0109 .  fentaNYL (SUBLIMAZE) bolus via infusion 25 mcg, 25 mcg, Intravenous, Q30 min PRN, Rahul P Desai, PA-C .  heparin ADULT infusion 100 units/mL (25000 units/250 mL), 1,000 Units/hr, Intravenous, Continuous, Lennette Bihari, MD, Last Rate: 10 mL/hr at 10/12/15 2339, 1,000 Units/hr at 10/12/15 2339 .  insulin aspart (novoLOG) injection 2-6 Units, 2-6 Units, Subcutaneous, 6 times per day, Roslynn Amble, MD, 2 Units at 10/12/15 2338 .  insulin glargine (LANTUS) injection 10 Units, 10 Units, Subcutaneous, Q24H, Roslynn Amble, MD, 10 Units at 10/12/15 2129 .  insulin regular (NOVOLIN R,HUMULIN R) 250 Units in sodium chloride 0.9 % 250 mL (1 Units/mL) infusion, , Intravenous, Continuous, Lennette Bihari, MD, Stopped at 10/11/15 1900 .  lacosamide (VIMPAT) 100 mg in sodium chloride 0.9 % 25 mL IVPB, 100 mg, Intravenous, Q12H, Noel Christmas, 100 mg at 10/12/15 2317 .  levETIRAcetam (KEPPRA) 1,500 mg in sodium chloride 0.9 % 100 mL IVPB, 1,500 mg, Intravenous, Q12H, Noel Christmas, 1,500 mg at 10/13/15 0700 .  midazolam (VERSED) 100 mg in sodium chloride 0.9 % 100 mL (1 mg/mL) infusion, 1-30 mg/hr, Intravenous, Continuous, Noel Christmas, Last Rate: 30 mL/hr at 10/13/15 0610, 30 mg/hr at 10/13/15 0610 .  midazolam (VERSED) bolus via infusion 1 mg, 1 mg, Intravenous, Q30 min PRN, Rahul P Desai, PA-C .  norepinephrine (LEVOPHED) 16 mg in dextrose 5 % 250 mL (0.064 mg/mL) infusion, 0-50 mcg/min, Intravenous, Titrated, Alyson Reedy, MD, Last Rate: 15 mL/hr at 10/13/15 0525, 16 mcg/min at 10/13/15 0525 .  ondansetron (ZOFRAN) injection 4 mg, 4 mg, Intravenous, Q6H PRN, Lennette Bihari, MD .  pantoprazole (PROTONIX) injection 40 mg, 40 mg, Intravenous, QHS, Rahul P Desai, PA-C, 40 mg at 10/12/15 2114 .  propofol (DIPRIVAN) 1000 MG/100ML infusion, 5-70 mcg/kg/min, Intravenous, Continuous, Alyson Reedy, MD, Last Rate: 28 mL/hr at 10/13/15 0610, 50 mcg/kg/min at 10/13/15 0610 .  sodium chloride flush (NS) 0.9 % injection 10-40 mL, 10-40 mL, Intracatheter, Q12H, Roslynn Amble, MD, 10 mL at 10/12/15 1000 .  sodium chloride flush (NS) 0.9 % injection 10-40 mL, 10-40 mL, Intracatheter, PRN, Roslynn Amble, MD .  valproate (DEPACON) 466 mg in dextrose 5 % 50 mL IVPB, 15 mg/kg/day, Intravenous, 3 times per day, Caryl Pina, MD, 466 mg at 10/13/15 0520 Studies/Results: No results found.  Medications:REviewed    Cardiac Arrest:  Discussed with nurse.  Will leave IABP in given low EF 25% and inability to revascularize.  Once neuro status clarified and patient  weanable can decide on pulling IABP.  Augmentation good no hematoma.      LOS: 3 days   Charlton Haws 10/13/2015, 7:40 AM

## 2015-10-13 NOTE — Progress Notes (Signed)
Reassessed at 6:10 PM. EEG still shows epileptiform bursts interspersed with suppression, but may be subtly improved relative to my last assessment.   Increasing Versed gtt to 65 mg/hr. Have discussed this option with MICU attending earlier today, with consensus that Versed would continue to be titrated upwards until there is cessation of epileptic activity during bursts, or achievement of a flatline EEG.   Continue propofol at 70 mcg/kg/min, as well as scheduled scheduled IV Keppra, Vimpat and Depakote at current doses  Caryl PinaEric Anahid Eskelson, MD

## 2015-10-13 NOTE — Progress Notes (Signed)
Subjective: Intubated and sedated. No longer exhibits twitching.   Objective: Current vital signs: BP 100/60 mmHg  Pulse 67  Temp(Src) 98.6 F (37 C) (Core (Comment))  Resp 0  Ht 6' (1.829 m)  Wt 93.2 kg (205 lb 7.5 oz)  BMI 27.86 kg/m2  SpO2 96% Vital signs in last 24 hours: Temp:  [95.5 F (35.3 C)-99 F (37.2 C)] 98.6 F (37 C) (03/25 0700) Pulse Rate:  [66-99] 67 (03/25 0828) Resp:  [0-30] 0 (03/25 0828) BP: (80-128)/(42-76) 100/60 mmHg (03/25 0740) SpO2:  [92 %-100 %] 96 % (03/25 0828) FiO2 (%):  [30 %-50 %] 30 % (03/25 0740)  Intake/Output from previous day: 03/24 0701 - 03/25 0700 In: 3751.5 [I.V.:3087.2; IV Piggyback:664.3] Out: 2623 [Urine:2623] Intake/Output this shift: Total I/O In: -  Out: 145 [Urine:145] Nutritional status:    Neurological exam: Gen: Intubated and sedated MS: Unresponsive to verbal, tactile and noxious stimuli. On Versed at 20 mg/Hr and paralyzed with Nimbex at time of exam. CN: Pupils unreactive, no dolls eye reflex or blink to threat. No adventitious movements.   Motor/Sensory: Flaccid tone throughout, with no movement to tactile stimulation (on Nimbex)  Lab Results: Basic Metabolic Panel:  Recent Labs Lab 10/11/15 0400  10/11/15 1105 10/11/15 2135 10/12/15 0450 10/12/15 1030 10/13/15 0356  NA 141  138  < > 141 140 141 140 144  K 3.1*  3.0*  < > 2.8* 3.9 3.6 3.5 3.6  CL 110  110  < > 112* 113* 115* 114* 117*  CO2 19*  18*  --  19* 18* 18* 18* 19*  GLUCOSE 258*  262*  < > 179* 156* 157* 156* 128*  BUN 13  13  < > CREATININE 1.01  1.01  < > 0.87 0.76 0.76 0.91 1.19  CALCIUM 8.6*  8.6*  --  8.4* 8.4* 8.2* 8.2* 8.2*  MG 2.1  --  1.9  --  1.8  --  2.0  PHOS 1.6*  --   --   --  3.5  --  3.8  < > = values in this interval not displayed.  Liver Function Tests:  Recent Labs Lab Oct 13, 2015 1928  AST 150*  ALT 166*  ALKPHOS 72  BILITOT 0.8  PROT 7.2  ALBUMIN 3.4*   No results for input(s): LIPASE,  AMYLASE in the last 168 hours. No results for input(s): AMMONIA in the last 168 hours.  CBC:  Recent Labs Lab Oct 13, 2015 1928  10/11/15 0227 10/11/15 0400 10/11/15 0420 10/12/15 0450 10/13/15 0356  WBC 8.9  --   --  7.8  --  6.6 7.9  NEUTROABS 3.9  --   --   --   --   --   --   HGB 13.1  < > 12.9* 12.6* 13.6 12.0* 12.0*  HCT 41.1  < > 38.0* 38.4* 40.0 37.3* 36.5*  MCV 94.3  --   --  90.6  --  91.0 93.4  PLT 192  --   --  173  --  167 168  < > = values in this interval not displayed.  Cardiac Enzymes:  Recent Labs Lab 10-13-2015 2200 10/11/15 0400 10/11/15 1105 10/11/15 1600  TROPONINI 0.07* 0.29* 0.34* 0.38*    Lipid Panel: No results for input(s): CHOL, TRIG, HDL, CHOLHDL, VLDL, LDLCALC in the last 168 hours.  CBG:  Recent Labs Lab 10/12/15 1600 10/12/15 1941 10/12/15 2330 10/13/15 0346 10/13/15 0808  GLUCAP 111* 132* 121* 115*  131*    Microbiology: Results for orders placed or performed during the hospital encounter of 10/08/2015  MRSA PCR Screening     Status: None   Collection Time: 09/29/2015 10:28 PM  Result Value Ref Range Status   MRSA by PCR NEGATIVE NEGATIVE Final    Comment:        The GeneXpert MRSA Assay (FDA approved for NASAL specimens only), is one component of a comprehensive MRSA colonization surveillance program. It is not intended to diagnose MRSA infection nor to guide or monitor treatment for MRSA infections.   Culture, respiratory (NON-Expectorated)     Status: None (Preliminary result)   Collection Time: 10/12/15  1:10 AM  Result Value Ref Range Status   Specimen Description TRACHEAL ASPIRATE  Final   Special Requests Normal  Final   Gram Stain   Final    ABUNDANT WBC PRESENT,BOTH PMN AND MONONUCLEAR RARE SQUAMOUS EPITHELIAL CELLS PRESENT ABUNDANT GRAM NEGATIVE COCCOBACILLI RARE GRAM POSITIVE COCCI IN PAIRS Performed at [facility]    Culture PENDING  Incomplete   Report Status PENDING  Incomplete    Coagulation  Studies:  Recent Labs  09/28/2015 2200 10/11/15 0400  LABPROT 15.4* 15.1  INR 1.20 1.18    Imaging: Dg Chest Port 1 View  10/12/2015  CLINICAL DATA:  Check endotracheal tube placement EXAM: PORTABLE CHEST 1 VIEW COMPARISON:  10/19/2015 FINDINGS: Cardiac shadow is stable. An endotracheal tube is again seen approximately 5 cm above the carina. A right jugular central line and nasogastric catheter are again noted and stable. New increased density is noted in the right upper lobe consistent with consolidation likely related mucous plugging given the acuteness of the abnormality. Right-sided pleural effusion is noted. The left lung remains clear. IMPRESSION: New right upper lobe consolidation likely related to mucous plugging. Electronically Signed   By: Alcide CleverMark  Lukens M.D.   On: 10/12/2015 07:19    Medications:   Current facility-administered medications:  .  0.9 %  sodium chloride infusion, , Intravenous, Continuous, Jeanella CrazeBrandi L Ollis, NP, Last Rate: 10 mL/hr at 10/13/15 0800 .  acetaminophen (TYLENOL) tablet 650 mg, 650 mg, Oral, Q4H PRN, Lennette Biharihomas A Kelly, MD .  antiseptic oral rinse solution (CORINZ), 7 mL, Mouth Rinse, 10 times per day, Roslynn AmbleJennings E Nestor, MD, 7 mL at 10/13/15 0600 .  artificial tears (LACRILUBE) ophthalmic ointment 1 application, 1 application, Both Eyes, 3 times per day, Rahul P Desai, PA-C, 1 application at 10/12/15 1413 .  aspirin chewable tablet 81 mg, 81 mg, Oral, Daily, Lennette Biharihomas A Kelly, MD, 81 mg at 10/12/15 0953 .  atorvastatin (LIPITOR) tablet 80 mg, 80 mg, Oral, q1800, Lennette Biharihomas A Kelly, MD, 80 mg at 10/12/15 1705 .  chlorhexidine gluconate (PERIDEX) 0.12 % solution 15 mL, 15 mL, Mouth Rinse, BID, Roslynn AmbleJennings E Nestor, MD, 15 mL at 10/13/15 0800 .  dextrose 10 % infusion, , Intravenous, Continuous PRN, Roslynn AmbleJennings E Nestor, MD .  fentaNYL (SUBLIMAZE) 2,500 mcg in sodium chloride 0.9 % 250 mL (10 mcg/mL) infusion, 25-400 mcg/hr, Intravenous, Continuous, Rahul P Desai, PA-C, Last Rate:  40 mL/hr at 10/13/15 0833, 400 mcg/hr at 10/13/15 0833 .  fentaNYL (SUBLIMAZE) bolus via infusion 25 mcg, 25 mcg, Intravenous, Q30 min PRN, Rahul P Desai, PA-C .  heparin ADULT infusion 100 units/mL (25000 units/250 mL), 1,000 Units/hr, Intravenous, Continuous, Lennette Biharihomas A Kelly, MD, Last Rate: 10 mL/hr at 10/12/15 2339, 1,000 Units/hr at 10/12/15 2339 .  insulin aspart (novoLOG) injection 2-6 Units, 2-6 Units, Subcutaneous, 6 times  per day, Roslynn Amble, MD, 2 Units at 10/13/15 [phone] .  insulin glargine (LANTUS) injection 10 Units, 10 Units, Subcutaneous, Q24H, Roslynn Amble, MD, 10 Units at 10/12/15 2129 .  insulin regular (NOVOLIN R,HUMULIN R) 250 Units in sodium chloride 0.9 % 250 mL (1 Units/mL) infusion, , Intravenous, Continuous, Lennette Bihari, MD, Stopped at 10/11/15 1900 .  lacosamide (VIMPAT) 100 mg in sodium chloride 0.9 % 25 mL IVPB, 100 mg, Intravenous, Q12H, Noel Christmas, 100 mg at 10/12/15 2317 .  levETIRAcetam (KEPPRA) 1,500 mg in sodium chloride 0.9 % 100 mL IVPB, 1,500 mg, Intravenous, Q12H, Noel Christmas, 1,500 mg at 10/13/15 0700 .  midazolam (VERSED) 100 mg in sodium chloride 0.9 % 100 mL (1 mg/mL) infusion, 1-30 mg/hr, Intravenous, Continuous, Noel Christmas, Last Rate: 30 mL/hr at 10/13/15 0921, 30 mg/hr at 10/13/15 0921 .  midazolam (VERSED) bolus via infusion 1 mg, 1 mg, Intravenous, Q30 min PRN, Rahul P Desai, PA-C .  norepinephrine (LEVOPHED) 16 mg in dextrose 5 % 250 mL (0.064 mg/mL) infusion, 0-50 mcg/min, Intravenous, Titrated, Alyson Reedy, MD, Last Rate: 16.9 mL/hr at 10/13/15 0828, 18 mcg/min at 10/13/15 0828 .  ondansetron (ZOFRAN) injection 4 mg, 4 mg, Intravenous, Q6H PRN, Lennette Bihari, MD .  pantoprazole (PROTONIX) injection 40 mg, 40 mg, Intravenous, QHS, Rahul P Desai, PA-C, 40 mg at 10/12/15 2114 .  propofol (DIPRIVAN) 1000 MG/100ML infusion, 5-70 mcg/kg/min, Intravenous, Continuous, Alyson Reedy, MD, Last Rate: 28 mL/hr at 10/13/15 0922, 50  mcg/kg/min at 10/13/15 0922 .  sodium chloride flush (NS) 0.9 % injection 10-40 mL, 10-40 mL, Intracatheter, Q12H, Roslynn Amble, MD, 10 mL at 10/12/15 1000 .  sodium chloride flush (NS) 0.9 % injection 10-40 mL, 10-40 mL, Intracatheter, PRN, Roslynn Amble, MD .  valproate (DEPACON) 466 mg in dextrose 5 % 50 mL IVPB, 15 mg/kg/day, Intravenous, 3 times per day, Caryl Pina, MD, 466 mg at 10/13/15 0520   Impression:  1. Status epilepticus S/P cardiac arrest. Currently on 30 mg Versed drip per hour. Propofol drip continues as well. Epileptiform discharges on bedside EEG monitor have decreased somewhat in duration and frequency, but are still quite prominent - intervening periods of electrocerebral silence consistent with burst suppression. On scheduled IV Keppra, Vimpat and Depakote. 2. 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 Flatline EEG between bursts implies a poor prognosis. Will continue to monitor with continuous EEG.  3. Valproic acid level has been ordered.    Recommendations 1. Continue scheduled scheduled IV Keppra, Vimpat and Depakote at current doses.  2. Continue propofol gtt.  3. Increase Versed gtt by 50% to 45 mg/hr.  4. Follow LFTs.  Caryl Pina, MD 10/13/2015, 9:40 AM

## 2015-10-13 NOTE — Progress Notes (Signed)
LTM day 3- checked pts head for skin breakdown; none noted. Reprepped CZ.

## 2015-10-13 NOTE — Progress Notes (Signed)
ANTICOAGULATION CONSULT NOTE - Follow-up Consult  Pharmacy Consult for Heparin  Indication: IABP  Allergies  Allergen Reactions  . Lisinopril Swelling and Other (See Comments)    Throat swelling   . Ace Inhibitors Swelling    Patient Measurements: Height: 6' (182.9 cm) Weight: 205 lb 7.5 oz (93.2 kg) IBW/kg (Calculated) : 77.6   Vital Signs: Temp: 98.6 F (37 C) (03/25 0100) Temp Source: Core (Comment) (03/25 0100) BP: 113/72 mmHg (03/25 0000) Pulse Rate: 71 (03/25 0100)  Labs:  Recent Labs  10/13/2015 1928  10/03/2015 2200  10/11/15 0400 10/11/15 0420 10/11/15 1105 10/11/15 1600 10/11/15 2135 10/12/15 0450 10/12/15 1030 10/12/15 1155 10/12/15 2330  HGB 13.1  < >  --   < > 12.6* 13.6  --   --   --  12.0*  --   --   --   HCT 41.1  < >  --   < > 38.4* 40.0  --   --   --  37.3*  --   --   --   PLT 192  --   --   --  173  --   --   --   --  167  --   --   --   APTT  --   --  58*  --  62*  --   --   --   --   --   --   --   --   LABPROT  --   --  15.4*  --  15.1  --   --   --   --   --   --   --   --   INR  --   --  1.20  --  1.18  --   --   --   --   --   --   --   --   HEPARINUNFRC  --   --   --   < > 0.37  --  0.39  --   --  0.37  --  0.28* 0.27*  CREATININE 1.38*  < > 1.19  < > 1.01  1.01 0.90 0.87  --  0.76 0.76 0.91  --   --   TROPONINI  --   < > 0.07*  --  0.29*  --  0.34* 0.38*  --   --   --   --   --   < > = values in this interval not displayed.  Estimated Creatinine Clearance: 98.5 mL/min (by C-G formula based on Cr of 0.91).   Assessment: [age] yom admitted 10/07/2015 with STEMI. S/p cardiac arrest. Pt received CPR and 2 episodes w/ ROSC. S/p PCI and IABP inserted 3/22. S/p hypothermia protocol - fully rewarmed ~1300 3/24. Pt continues on heparin for ACS/IABP. Heparin level 0.27 (therapeutic) on 1000 units/hr. No further bleeding noted.  Goal of Therapy:  HL 0.2-0.5 Monitor platelets by anticoagulation protocol: Yes   Plan:  Continue heparin 1000  units/hr Daily CBC, HL  Christoper Fabianaron Edessa Jakubowicz, PharmD, BCPS Clinical pharmacist, pager [phone] 10/13/2015 1:09 AM

## 2015-10-13 NOTE — Progress Notes (Signed)
ANTICOAGULATION CONSULT NOTE - Follow-up Consult  Pharmacy Consult for Heparin  Indication: IABP  Allergies  Allergen Reactions  . Lisinopril Swelling and Other (See Comments)    Throat swelling   . Ace Inhibitors Swelling    Patient Measurements: Height: 6' (182.9 cm) Weight: 205 lb 7.5 oz (93.2 kg) IBW/kg (Calculated) : 77.6   Vital Signs: Temp: 98.6 F (37 C) (03/25 0700) Temp Source: Core (Comment) (03/25 0700) BP: 100/60 mmHg (03/25 0740) Pulse Rate: 67 (03/25 0828)  Labs:  Recent Labs  August 22, 2015 2200  10/11/15 0400 10/11/15 0420 10/11/15 1105 10/11/15 1600  10/12/15 0450 10/12/15 1030 10/12/15 1155 10/12/15 2330 10/13/15 0350 10/13/15 0356  HGB  --   < > 12.6* 13.6  --   --   --  12.0*  --   --   --   --  12.0*  HCT  --   < > 38.4* 40.0  --   --   --  37.3*  --   --   --   --  36.5*  PLT  --   --  173  --   --   --   --  167  --   --   --   --  168  APTT 58*  --  62*  --   --   --   --   --   --   --   --   --   --   LABPROT 15.4*  --  15.1  --   --   --   --   --   --   --   --   --   --   INR 1.20  --  1.18  --   --   --   --   --   --   --   --   --   --   HEPARINUNFRC  --   < > 0.37  --  0.39  --   --  0.37  --  0.28* 0.27* 0.22*  --   CREATININE 1.19  < > 1.01  1.01 0.90 0.87  --   < > 0.76 0.91  --   --   --  1.19  TROPONINI 0.07*  --  0.29*  --  0.34* 0.38*  --   --   --   --   --   --   --   < > = values in this interval not displayed.  Estimated Creatinine Clearance: 75.3 mL/min (by C-G formula based on Cr of 1.19).   Assessment: [age] yom admitted May 29, 2016 with STEMI. S/p cardiac arrest. Pt received CPR and 2 episodes w/ ROSC. S/p PCI and IABP inserted 3/22. S/p hypothermia protocol - fully rewarmed ~1300 3/24. Pt continues on heparin for ACS/IABP. Heparin level 0.22 (therapeutic) on 1000 units/hr. No further bleeding noted.  Goal of Therapy:  HL 0.2-0.5 Monitor platelets by anticoagulation protocol: Yes   Plan:  Continue heparin 1000  units/hr Daily CBC, HL   Sherron MondayAubrey N. Rodelo, PharmD Clinical Pharmacy Resident Pager: [phone] 10/13/2015 9:25 AM

## 2015-10-14 ENCOUNTER — Inpatient Hospital Stay (HOSPITAL_COMMUNITY): Payer: Medicare Other

## 2015-10-14 LAB — GLUCOSE, CAPILLARY
Glucose-Capillary: 103 mg/dL — ABNORMAL HIGH (ref 65–99)
Glucose-Capillary: 108 mg/dL — ABNORMAL HIGH (ref 65–99)
Glucose-Capillary: 111 mg/dL — ABNORMAL HIGH (ref 65–99)
Glucose-Capillary: 115 mg/dL — ABNORMAL HIGH (ref 65–99)
Glucose-Capillary: 115 mg/dL — ABNORMAL HIGH (ref 65–99)
Glucose-Capillary: 117 mg/dL — ABNORMAL HIGH (ref 65–99)

## 2015-10-14 LAB — CULTURE, RESPIRATORY W GRAM STAIN: Special Requests: NORMAL

## 2015-10-14 LAB — HEPARIN LEVEL (UNFRACTIONATED)
HEPARIN UNFRACTIONATED: 0.27 [IU]/mL — AB (ref 0.30–0.70)
Heparin Unfractionated: 0.17 IU/mL — ABNORMAL LOW (ref 0.30–0.70)
Heparin Unfractionated: 0.22 IU/mL — ABNORMAL LOW (ref 0.30–0.70)

## 2015-10-14 LAB — COMPREHENSIVE METABOLIC PANEL
ALT: 85 U/L — ABNORMAL HIGH (ref 17–63)
ANION GAP: 9 (ref 5–15)
AST: 39 U/L (ref 15–41)
Albumin: 2.1 g/dL — ABNORMAL LOW (ref 3.5–5.0)
Alkaline Phosphatase: 93 U/L (ref 38–126)
BILIRUBIN TOTAL: 0.4 mg/dL (ref 0.3–1.2)
BUN: 7 mg/dL (ref 6–20)
CALCIUM: 8.2 mg/dL — AB (ref 8.9–10.3)
CO2: 19 mmol/L — ABNORMAL LOW (ref 22–32)
Chloride: 123 mmol/L — ABNORMAL HIGH (ref 101–111)
Creatinine, Ser: 1.28 mg/dL — ABNORMAL HIGH (ref 0.61–1.24)
GFR calc Af Amer: >60 mL/min (ref >60)
GFR, EST NON AFRICAN AMERICAN: 58 mL/min — AB (ref >60)
Glucose, Bld: 126 mg/dL — ABNORMAL HIGH (ref 65–99)
POTASSIUM: 3.8 mmol/L (ref 3.5–5.1)
Sodium: 151 mmol/L — ABNORMAL HIGH (ref 135–145)
TOTAL PROTEIN: 5.6 g/dL — AB (ref 6.5–8.1)

## 2015-10-14 LAB — CBC
HEMATOCRIT: 37.1 % — AB (ref 39.0–52.0)
HEMOGLOBIN: 11.6 g/dL — AB (ref 13.0–17.0)
MCH: 29.8 pg (ref 26.0–34.0)
MCHC: 31.3 g/dL (ref 30.0–36.0)
MCV: 95.4 fL (ref 78.0–100.0)
Platelets: 158 10*3/uL (ref 150–400)
RBC: 3.89 MIL/uL — AB (ref 4.22–5.81)
RDW: 14 % (ref 11.5–15.5)
WBC: 10.4 10*3/uL (ref 4.0–10.5)

## 2015-10-14 LAB — URINE CULTURE
Culture: NO GROWTH
Special Requests: NORMAL

## 2015-10-14 LAB — CULTURE, RESPIRATORY

## 2015-10-14 MED ORDER — FREE WATER
200.0000 mL | Freq: Three times a day (TID) | Status: DC
  Administered 2015-10-14 – 2015-10-18 (×9): 200 mL

## 2015-10-14 MED ORDER — VITAL HIGH PROTEIN PO LIQD
1000.0000 mL | ORAL | Status: DC
  Administered 2015-10-14: 19:00:00
  Administered 2015-10-14 – 2015-10-15 (×3): 1000 mL
  Administered 2015-10-15: 08:00:00

## 2015-10-14 MED ORDER — SODIUM CHLORIDE 0.9 % IV SOLN
100.0000 mg/h | INTRAVENOUS | Status: DC
  Administered 2015-10-14 – 2015-10-15 (×8): 100 mg/h via INTRAVENOUS
  Filled 2015-10-14 (×10): qty 50

## 2015-10-14 NOTE — Progress Notes (Signed)
Patient ID: Clarence Brooks, male   DOB: 1951/02/06, 65 y.o.   MRN: [id number]   Subjective: Intubated  Sedated  Continues to have seizure activity  Objective: Filed Vitals:   10/14/15 0500 10/14/15 0528 10/14/15 0600 10/14/15 0700  BP:   116/72   Pulse: 73 75 74 74  Temp: 98.6 F (37 C)  98.6 F (37 C) 98.6 F (37 C)  TempSrc:      Resp: Height:      Weight:      SpO2: 92% 87% 93% 94%   Weight change:   Intake/Output Summary (Last 24 hours) at 10/14/15 0723 Last data filed at 10/14/15 0700  Gross per 24 hour  Intake 4496.48 ml  Output   4470 ml  Net  26.48 ml    General: INtubated  Sedated   HEENT:  Has EEG leads in place  Neck:  Difficult to assess JVP   Heart: Regular rate and rhythm, without murmurs, rubs, gallops.  Lungs: Clear to auscultation.  No rales or wheezes. Exemities:  No edema.   Warming: Lines in place including right art line in radial IABP 1:1 with good augmentation  Tele:  SR  No VT  10/14/2015    Lab Results: Results for orders placed or performed during the hospital encounter of 10/13/2015 (from the past 24 hour(s))  Glucose, capillary     Status: Abnormal   Collection Time: 10/13/15  8:08 AM  Result Value Ref Range   Glucose-Capillary 131 (H) 65 - 99 mg/dL   Comment 1 Venous Specimen   Hepatic function panel     Status: Abnormal   Collection Time: 10/13/15 10:54 AM  Result Value Ref Range   Total Protein 5.4 (L) 6.5 - 8.1 g/dL   Albumin 2.2 (L) 3.5 - 5.0 g/dL   AST 48 (H) 15 - 41 U/L   ALT 106 (H) 17 - 63 U/L   Alkaline Phosphatase 77 38 - 126 U/L   Total Bilirubin 0.5 0.3 - 1.2 mg/dL   Bilirubin, Direct 0.2 0.1 - 0.5 mg/dL   Indirect Bilirubin 0.3 0.3 - 0.9 mg/dL  Ammonia     Status: None   Collection Time: 10/13/15 11:45 AM  Result Value Ref Range   Ammonia 34 9 - 35 umol/L  Glucose, capillary     Status: Abnormal   Collection Time: 10/13/15 11:46 AM  Result Value Ref Range   Glucose-Capillary 102 (H) 65 - 99 mg/dL   Comment 1 Venous Specimen   Valproic acid level     Status: Abnormal   Collection Time: 10/13/15 12:00 PM  Result Value Ref Range   Valproic Acid Lvl 41 (L) 50.0 - 100.0 ug/mL  Glucose, capillary     Status: Abnormal   Collection Time: 10/13/15  4:23 PM  Result Value Ref Range   Glucose-Capillary 136 (H) 65 - 99 mg/dL   Comment 1 Arterial Specimen   Glucose, capillary     Status: Abnormal   Collection Time: 10/13/15  7:35 PM  Result Value Ref Range   Glucose-Capillary 107 (H) 65 - 99 mg/dL  Glucose, capillary     Status: Abnormal   Collection Time: 10/13/15 11:31 PM  Result Value Ref Range   Glucose-Capillary 106 (H) 65 - 99 mg/dL  Glucose, capillary     Status: Abnormal   Collection Time: 10/14/15  3:21 AM  Result Value Ref Range   Glucose-Capillary 111 (H) 65 - 99 mg/dL  Heparin level (unfractionated)  Status: Abnormal   Collection Time: 10/14/15  4:30 AM  Result Value Ref Range   Heparin Unfractionated 0.17 (L) 0.30 - 0.70 IU/mL  CBC     Status: Abnormal   Collection Time: 10/14/15  4:30 AM  Result Value Ref Range   WBC 10.4 4.0 - 10.5 K/uL   RBC 3.89 (L) 4.22 - 5.81 MIL/uL   Hemoglobin 11.6 (L) 13.0 - 17.0 g/dL   HCT 40.937.1 (L) 81.139.0 - 91.452.0 %   MCV 95.4 78.0 - 100.0 fL   MCH 29.8 26.0 - 34.0 pg   MCHC 31.3 30.0 - 36.0 g/dL   RDW 78.214.0 95.611.5 - 21.315.5 %   Platelets 158 150 - 400 K/uL  Comprehensive metabolic panel     Status: Abnormal   Collection Time: 10/14/15  4:30 AM  Result Value Ref Range   Sodium 151 (H) 135 - 145 mmol/L   Potassium 3.8 3.5 - 5.1 mmol/L   Chloride 123 (H) 101 - 111 mmol/L   CO2 19 (L) 22 - 32 mmol/L   Glucose, Bld 126 (H) 65 - 99 mg/dL   BUN 7 6 - 20 mg/dL   Creatinine, Ser 0.861.28 (H) 0.61 - 1.24 mg/dL   Calcium 8.2 (L) 8.9 - 10.3 mg/dL   Total Protein 5.6 (L) 6.5 - 8.1 g/dL   Albumin 2.1 (L) 3.5 - 5.0 g/dL   AST 39 15 - 41 U/L   ALT 85 (H) 17 - 63 U/L   Alkaline Phosphatase 93 38 - 126 U/L   Total Bilirubin 0.4 0.3 - 1.2 mg/dL   GFR calc non  Af Amer 58 (L) >60 mL/min   GFR calc Af Amer >60 >60 mL/min   Anion gap 9 5 - 15    Current facility-administered medications:  .  0.9 %  sodium chloride infusion, , Intravenous, Continuous, Jeanella CrazeBrandi L Ollis, NP, Last Rate: 10 mL/hr at 10/13/15 2008 .  0.9 %  sodium chloride infusion, , Intravenous, Continuous, Jeanella CrazeBrandi L Ollis, NP, Last Rate: 10 mL/hr at 10/13/15 1525 .  acetaminophen (TYLENOL) tablet 650 mg, 650 mg, Oral, Q4H PRN, Lennette Biharihomas A Kelly, MD .  antiseptic oral rinse solution (CORINZ), 7 mL, Mouth Rinse, 10 times per day, Roslynn AmbleJennings E Nestor, MD, 7 mL at 10/14/15 0600 .  artificial tears (LACRILUBE) ophthalmic ointment 1 application, 1 application, Both Eyes, 3 times per day, Rahul P Desai, PA-C, 1 application at 10/14/15 0536 .  aspirin chewable tablet 81 mg, 81 mg, Oral, Daily, Lennette Biharihomas A Kelly, MD, 81 mg at 10/13/15 1059 .  atorvastatin (LIPITOR) tablet 80 mg, 80 mg, Oral, q1800, Lennette Biharihomas A Kelly, MD, 80 mg at 10/13/15 1801 .  chlorhexidine gluconate (PERIDEX) 0.12 % solution 15 mL, 15 mL, Mouth Rinse, BID, Roslynn AmbleJennings E Nestor, MD, 15 mL at 10/13/15 2000 .  dextrose 10 % infusion, , Intravenous, Continuous PRN, Roslynn AmbleJennings E Nestor, MD .  feeding supplement (VITAL HIGH PROTEIN) liquid 1,000 mL, 1,000 mL, Per Tube, Q24H, Brandi L Ollis, NP .  fentaNYL (SUBLIMAZE) 2,500 mcg in sodium chloride 0.9 % 250 mL (10 mcg/mL) infusion, 25-400 mcg/hr, Intravenous, Continuous, Rahul P Desai, PA-C, Last Rate: 40 mL/hr at 10/14/15 0530, 400 mcg/hr at 10/14/15 0530 .  fentaNYL (SUBLIMAZE) bolus via infusion 25 mcg, 25 mcg, Intravenous, Q30 min PRN, Rahul P Desai, PA-C .  heparin ADULT infusion 100 units/mL (25000 units/250 mL), 1,100 Units/hr, Intravenous, Continuous, Veronda Lanice Schwab Bryk, RPH, Last Rate: 11 mL/hr at 10/14/15 0650, 1,100 Units/hr at 10/14/15 0650 .  insulin aspart (novoLOG) injection 2-6 Units, 2-6 Units, Subcutaneous, 6 times per day, Roslynn Amble, MD, 2 Units at 10/13/15 1642 .  insulin glargine  (LANTUS) injection 10 Units, 10 Units, Subcutaneous, Q24H, Roslynn Amble, MD, 10 Units at 10/13/15 2200 .  insulin regular (NOVOLIN R,HUMULIN R) 250 Units in sodium chloride 0.9 % 250 mL (1 Units/mL) infusion, , Intravenous, Continuous, Lennette Bihari, MD, Stopped at 10/11/15 1900 .  lacosamide (VIMPAT) 100 mg in sodium chloride 0.9 % 25 mL IVPB, 100 mg, Intravenous, Q12H, Noel Christmas, 100 mg at 10/13/15 2200 .  levETIRAcetam (KEPPRA) 1,500 mg in sodium chloride 0.9 % 100 mL IVPB, 1,500 mg, Intravenous, Q12H, Noel Christmas, 1,500 mg at 10/13/15 2005 .  midazolam (VERSED) 200 mg in sodium chloride 0.9 % 200 mL (1 mg/mL) infusion, 65 mg/hr, Intravenous, Continuous, Caryl Pina, MD, Last Rate: 65 mL/hr at 10/14/15 0607, 65 mg/hr at 10/14/15 0607 .  midazolam (VERSED) bolus via infusion 1 mg, 1 mg, Intravenous, Q30 min PRN, Rahul P Desai, PA-C .  norepinephrine (LEVOPHED) 16 mg in dextrose 5 % 250 mL (0.064 mg/mL) infusion, 0-50 mcg/min, Intravenous, Titrated, Alyson Reedy, MD, Last Rate: 14.1 mL/hr at 10/14/15 0014, 15 mcg/min at 10/14/15 0014 .  ondansetron (ZOFRAN) injection 4 mg, 4 mg, Intravenous, Q6H PRN, Lennette Bihari, MD .  pantoprazole sodium (PROTONIX) 40 mg/20 mL oral suspension 40 mg, 40 mg, Per Tube, Daily, Sherron Monday, RPH, 40 mg at 10/13/15 1059 .  propofol (DIPRIVAN) 1000 MG/100ML infusion, 5-70 mcg/kg/min, Intravenous, Continuous, Alyson Reedy, MD, Last Rate: 39.1 mL/hr at 10/14/15 0609, 70 mcg/kg/min at 10/14/15 0609 .  sodium chloride flush (NS) 0.9 % injection 10-40 mL, 10-40 mL, Intracatheter, Q12H, Roslynn Amble, MD, 10 mL at 10/12/15 1000 .  sodium chloride flush (NS) 0.9 % injection 10-40 mL, 10-40 mL, Intracatheter, PRN, Roslynn Amble, MD .  valproate (DEPACON) 500 mg in dextrose 5 % 50 mL IVPB, 500 mg, Intravenous, 3 times per day, Caryl Pina, MD, 500 mg at 10/14/15 0536 Studies/Results: No results found.  Medications:REviewed    Cardiac Arrest:  .   Will leave IABP in given low EF 25% and inability to revascularize.  Once neuro status clarified and patient  weanable can decide on pulling IABP.  Augmentation good no hematoma.  Biggest issue seems to be continued seizure activity Despite keppra, vimpat and depakote If level of care withdrawn can pull IABP in am     LOS: 4 days   Charlton Haws 10/14/2015, 7:23 AM

## 2015-10-14 NOTE — Progress Notes (Signed)
PULMONARY / CRITICAL CARE MEDICINE   Name: Clarence Brooks MRN: [id number] DOB: [birth year]    ADMISSION DATE:  09/20/2015 CONSULTATION DATE:  09/20/2015  REFERRING MD:  Claiborne Billings  CHIEF COMPLAINT:  Cardiac Arrest  BRIEF Hx:   Clarence Brooks is a 65 y.o. male with PMH as outlined below including HTN, HLD, DM2, obesity.  He was brought to [facility] ED 03/22 via EMS after a witnessed out of hospital VF arrest with EKG revealing anterolateral STEMI.  CPR was apparently immediately started on the scene and on EMS arrival, he was given 2 rounds of epi as well as 1 defibrillation prior to ROSC.  He had king airway placed in the field and this was switched to ETT in ED.  On arrival to ED, he was met by cardiology team who took him for emergent cardiac cath. Per RN report, pt had severe 3 vessel disease that was not amenable to PCI.  He had IABP placed for EF of roughly 25% and will need CVTS input.  Following cath, he returned to the ICU and hypothermia protocol was initiated.  SUBJECTIVE:  RN reports ongoing EEG activity despite 70 propofol, 400 fent, 65 versed (increased overnight by neurohospitalists).  No acute events.    VITAL SIGNS: BP 116/72 mmHg  Pulse 74  Temp(Src) 98.6 F (37 C) (Core (Comment))  Resp 10  Ht 6' (1.829 m)  Wt 205 lb 7.5 oz (93.2 kg)  BMI 27.86 kg/m2  SpO2 94%  HEMODYNAMICS: CVP:  [4 mmHg-7 mmHg] 6 mmHg  VENTILATOR SETTINGS: Vent Mode:  [-] PRVC FiO2 (%):  [30 %-40 %] 40 % Set Rate:  [18 bmp] 18 bmp Vt Set:  [768 mL] 620 mL PEEP:  [5 cmH20] 5 cmH20 Plateau Pressure:  [18 cmH20-20 cmH20] 20 cmH20  INTAKE / OUTPUT: I/O last 3 completed shifts: In: 6462.6 [I.V.:5738.3; IV Piggyback:724.3] Out: 6110 [Urine:6110]  PHYSICAL EXAMINATION: General: Adult male, resting in bed, critically ill.  Neuro: deep sedation, no response to stimuli HEENT: [place]/AT. PERRL, sclerae anicteric. Cardiovascular: RRR, no M/R/G.  Lungs: Respirations even and unlabored.  CTA bilaterally, No  W/R/R. Abdomen: BS x 4, soft, NT/ND. Cooling pads in place Musculoskeletal: No gross deformities, no edema.  Skin: Intact, warm, no rashes.  LABS:  BMET  Recent Labs Lab 10/12/15 1030 10/13/15 0356 10/14/15 0430  NA 140 144 151*  K 3.5 3.6 3.8  CL 114* 117* 123*  CO2 18* 19* 19*  BUN _0 CREATININE 0.91 1.19 1.28*  GLUCOSE 156* 128* 126*    Electrolytes  Recent Labs Lab 10/11/15 0400 10/11/15 1105  10/12/15 0450 10/12/15 1030 10/13/15 0356 10/14/15 0430  CALCIUM 8.6*  8.6* 8.4*  < > 8.2* 8.2* 8.2* 8.2*  MG 2.1 1.9  --  1.8  --  2.0  --   PHOS 1.6*  --   --  3.5  --  3.8  --   < > = values in this interval not displayed.  CBC  Recent Labs Lab 10/12/15 0450 10/13/15 0356 10/14/15 0430  WBC 6.6 7.9 10.4  HGB 12.0* 12.0* 11.6*  HCT 37.3* 36.5* 37.1*  PLT 167 168 158    Coag's  Recent Labs Lab 09/30/2015 2200 10/11/15 0400  APTT 58* 62*  INR 1.20 1.18    Sepsis Markers  Recent Labs Lab 10/09/2015 1936 09/22/2015 2243 10/11/15 0500  LATICACIDVEN 7.67* 2.2* 2.5*    ABG  Recent Labs Lab 10/11/15 0335 10/12/15 0416 10/13/15 0410  PHART 7.468* 7.429 7.377  PCO2ART 24.2* 27.0* 32.5*  PO2ART 155* 82.7 95.1    Liver Enzymes  Recent Labs Lab 09/19/2015 1928 10/13/15 1054 10/14/15 0430  AST 150* 48* 39  ALT 166* 106* 85*  ALKPHOS 72 77 93  BILITOT 0.8 0.5 0.4  ALBUMIN 3.4* 2.2* 2.1*    Cardiac Enzymes  Recent Labs Lab 10/11/15 0400 10/11/15 1105 10/11/15 1600  TROPONINI 0.29* 0.34* 0.38*    Glucose  Recent Labs Lab 10/13/15 0808 10/13/15 1146 10/13/15 1623 10/13/15 1935 10/13/15 2331 10/14/15 0321  GLUCAP 131* 102* 136* 107* 106* 111*    Imaging No results found.   STUDIES:  CXR 03/22 > no acute process. CXR 3/25 > images personally reviewed, R effusion ECHO 3/23 >> LVEF 20-25%, severe global hypokinesis, mod LVH, diastolic dysfunction, elevated filling pressure EEG 3/24 >> burst suppression pattern with  electrographic seizures within bursts, pattern usually associated with poor outcome   CULTURES: BCx2 3/23 >>  Sputum 3/24 >>  UC 3/23 >>   ANTIBIOTICS: None.  SIGNIFICANT EVENTS: 3/22  Admitted after out of hospital VF arrest.   3/25  Reached re-warming ~ 0200 3/26  Ongoing burst activity on EEG despite heavy (versed _0 , prop 70 mcg, fent 400 + AED's) sedation  LINES/TUBES: ETT 03/22 > R IJ CVL 3/22 > R rad A line 3/22 >   DISCUSSION: 65 y.o. M admitted 03/22 after out of hospital VF arrest.  He was taken to cath lab for emergent cardiac cath and  this revealed severe 3 vessel disease not amenable to PCI.  He had EF of 25% so IABP was placed. Following procedure, he returned to ICU where hypothermia protocol was initiated.  Re-warmed ~0200 3/25.    ASSESSMENT / PLAN:  CARDIOVASCULAR A:  Out of hospital VF arrest - s/p cardiac cath with reported severe 3 vessel disease not amenable to PCI.  EF of 25% so IABP was placed sCHF - reported EF of ~25% during cardiac cath - official report pending Severe 3 vessel CAD  Hx HTN, HLD P:  Continue normothermia protocol  Levophed as needed for MAP >80 Trend troponins / lactate Cardiology following, appreciate input ECHO results as above  Will need CVTS consult for CABG evaluation pending neuro prognostication  NEUROLOGIC A:   Acute metabolic encephalopathy - secondary to cardiac arrest. Concern for Seizure Activity  Hx anxiety P:   RASS goal: -5 with concern for seizures  Hold daily WUA with deep sedation for seizure Neuro critical care review of literature >> benzo rec's 0.2-0.4 mg/kg (pt's 0.4 = 83m/hr).   Neuro goal is for control of burst suppression x 48 hours, then reassess status, increase to 487mhr per Neuro.  Discussed with Dr. LiCheral Marker Neuro pushing versed/propofol for goal of flat EEG (not happened).  Consider pentobarbital.   EEG as above Neurology following, appreciate input Propofol, keppra, vimpat, valproate,  versed for sz control  PULMONARY A: Acute hypercarbic respiratory failure - hypercarbia resolved, due to respiratory insufficiency in the setting of cardiac arrest Mucus Plugging - resolved.  P:   PRVC, 8 cc/kg Wean PEEP / FiO2 for sats > 92% VAP prevention measures Hold SBT given sedation for seizures Albuterol PRN CXR intermittently   RENAL A:   Hypokalemia - anticipate worsening during hypothermia protocol Hypocalcemia Hyperchloremia - suspect 2/2 to NS administration AGMA - lactate P:   Replace electrolytes as indicated Reduce NS to KVO Trend BMP / UOP   GASTROINTESTINAL A:   GI prophylaxis Nutrition P:  SUP: Pantoprazole NPO  Begin TF   HEMATOLOGIC A:   Anemia - mild, no evidence of bleeding  VTE Prophylaxis P:  SCD's / heparin gtt per pharmacy  Trend CBC  INFECTIOUS A:   No indication of infection P:   Monitor clinically Cultures as above   ENDOCRINE A:   DM  2 TSH wnl  P:   ICU hyperglycemia protocol Lantus 10 units Q24    Family updated: No family bedside am 3/26  Interdisciplinary Family Meeting v Palliative Care Meeting:  Due by: 03/28    Noe Gens, NP-C  Pulmonary & Critical Care Pgr: [phone] or if no answer [phone] 10/14/2015, 7:10 AM

## 2015-10-14 NOTE — Progress Notes (Signed)
EEG did not restart on its own; tech restarted EEG this AM and fixed lead O2.

## 2015-10-14 NOTE — Progress Notes (Signed)
ANTICOAGULATION CONSULT NOTE - Follow-up Consult  Pharmacy Consult for Heparin  Indication: IABP  Allergies  Allergen Reactions  . Lisinopril Swelling and Other (See Comments)    Throat swelling   . Ace Inhibitors Swelling    Patient Measurements: Height: 6' (182.9 cm) Weight: 205 lb 7.5 oz (93.2 kg) IBW/kg (Calculated) : 77.6   Vital Signs: Temp: 98.6 F (37 C) (03/26 1200) Temp Source: Core (Comment) (03/26 1200) BP: 106/44 mmHg (03/26 1300) Pulse Rate: 76 (03/26 1300)  Labs:  Recent Labs  10/11/15 1600  10/12/15 0450 10/12/15 1030  10/13/15 0350 10/13/15 0356 10/14/15 0430 10/14/15 1250  HGB  --   < > 12.0*  --   --   --  12.0* 11.6*  --   HCT  --   --  37.3*  --   --   --  36.5* 37.1*  --   PLT  --   --  167  --   --   --  168 158  --   HEPARINUNFRC  --   --  0.37  --   < > 0.22*  --  0.17* 0.27*  CREATININE  --   < > 0.76 0.91  --   --  1.19 1.28*  --   TROPONINI 0.38*  --   --   --   --   --   --   --   --   < > = values in this interval not displayed.  Estimated Creatinine Clearance: 70 mL/min (by C-G formula based on Cr of 1.28).   Assessment: [age] yom admitted 11-10-15 with STEMI. S/p cardiac arrest. Pt received CPR and 2 episodes w/ ROSC. S/p PCI and IABP inserted 3/22. S/p hypothermia protocol - fully rewarmed ~1300 3/24. Pt continues on heparin for ACS/IABP. Heparin level trended down and dose was adjusted accordingly. Now HL therapeutic at 0.27 on 1100 units/hr. No further bleeding noted. CBC stable  Goal of Therapy:  HL 0.2-0.5 Monitor platelets by anticoagulation protocol: Yes   Plan:  Continue heparin 1100 units/hr Confirm HL in 6 hours Daily CBC, HL   Sherron MondayAubrey N. Hartt, PharmD Clinical Pharmacy Resident Pager: [phone] 10/14/2015 1:57 PM

## 2015-10-14 NOTE — Progress Notes (Signed)
ANTICOAGULATION CONSULT NOTE - Follow-up Consult  Pharmacy Consult for Heparin  Indication: IABP  Allergies  Allergen Reactions  . Lisinopril Swelling and Other (See Comments)    Throat swelling   . Ace Inhibitors Swelling    Patient Measurements: Height: 6' (182.9 cm) Weight: 220 lb 14.4 oz (100.2 kg) IBW/kg (Calculated) : 77.6   Vital Signs: Temp: 97.3 F (36.3 C) (03/26 1900) Temp Source: Core (Comment) (03/26 1600) BP: 100/66 mmHg (03/26 2000) Pulse Rate: 78 (03/26 2000)  Labs:  Recent Labs  10/12/15 0450 10/12/15 1030  10/13/15 0356 10/14/15 0430 10/14/15 1250 10/14/15 2018  HGB 12.0*  --   --  12.0* 11.6*  --   --   HCT 37.3*  --   --  36.5* 37.1*  --   --   PLT 167  --   --  168 158  --   --   HEPARINUNFRC 0.37  --   < >  --  0.17* 0.27* 0.22*  CREATININE 0.76 0.91  --  1.19 1.28*  --   --   < > = values in this interval not displayed.  Estimated Creatinine Clearance: 72.4 mL/min (by C-G formula based on Cr of 1.28).   Assessment: [age] yom admitted 10/15/2015 with STEMI. S/p cardiac arrest. Pt received CPR and 2 episodes w/ ROSC. S/p PCI and IABP inserted 3/22. Pt continues on heparin for ACS/IABP.  -heparin level= 0.22 and at goal  Goal of Therapy:  HL 0.2-0.5 Monitor platelets by anticoagulation protocol: Yes   Plan:   Continue heparin 1100 units/hr Daily CBC, HL  Harland Germanndrew Lana Flaim, Pharm D 10/14/2015 8:52 PM

## 2015-10-14 NOTE — Progress Notes (Signed)
Subjective: Off Nimbex. No recurrence of myoclonus on bedside assessments.   Objective: Current vital signs: BP 99/48 mmHg  Pulse 77  Temp(Src) 98.2 F (36.8 C) (Core (Comment))  Resp 18  Ht 6' (1.829 m)  Wt 100.2 kg (220 lb 14.4 oz)  BMI 29.95 kg/m2  SpO2 94% Vital signs in last 24 hours: Temp:  [98.2 F (36.8 C)-98.8 F (37.1 C)] 98.2 F (36.8 C) (03/26 1600) Pulse Rate:  [70-78] 77 (03/26 1700) Resp:  [7-21] 18 (03/26 1700) BP: (91-122)/(44-72) 99/48 mmHg (03/26 1700) SpO2:  [87 %-98 %] 94 % (03/26 1700) Arterial Line BP: (97-125)/(45-55) 99/48 mmHg (03/26 1700) FiO2 (%):  [30 %-40 %] 40 % (03/26 1627) Weight:  [100.2 kg (220 lb 14.4 oz)] 100.2 kg (220 lb 14.4 oz) (03/26 1600)  Intake/Output from previous day: 03/25 0701 - 03/26 0700 In: 4496.5 [I.V.:4146.5; IV Piggyback:350] Out: 4470 [Urine:4470] Intake/Output this shift: Total I/O In: 2562.3 [I.V.:2013; NG/GT:364.3; IV Piggyback:185] Out: 1200 [Urine:1200] Nutritional status:    Neurologic Exam: Gen: Intubated and sedated MS: Unresponsive to verbal, tactile and noxious stimuli. On Versed and propofol sedation at time of exam. CN: Pupils unreactive, no dolls eye reflex or blink to threat. No adventitious movements.  Motor/Sensory: Flaccid tone throughout, with no movement to tactile stimulation.  Lab Results: Basic Metabolic Panel:  Recent Labs Lab 10/11/15 0400  10/11/15 1105 10/11/15 2135 10/12/15 0450 10/12/15 1030 10/13/15 0356 10/14/15 0430  NA 141  138  < > 141 140 141 140 144 151*  K 3.1*  3.0*  < > 2.8* 3.9 3.6 3.5 3.6 3.8  CL 110  110  < > 112* 113* 115* 114* 117* 123*  CO2 19*  18*  --  19* 18* 18* 18* 19* 19*  GLUCOSE 258*  262*  < > 179* 156* 157* 156* 128* 126*  BUN 13  13  < > CREATININE 1.01  1.01  < > 0.87 0.76 0.76 0.91 1.19 1.28*  CALCIUM 8.6*  8.6*  --  8.4* 8.4* 8.2* 8.2* 8.2* 8.2*  MG 2.1  --  1.9  --  1.8  --  2.0  --   PHOS 1.6*  --   --   --  3.5  --   3.8  --   < > = values in this interval not displayed.  Liver Function Tests:  Recent Labs Lab 10/03/2015 1928 10/13/15 1054 10/14/15 0430  AST 150* 48* 39  ALT 166* 106* 85*  ALKPHOS 72 77 93  BILITOT 0.8 0.5 0.4  PROT 7.2 5.4* 5.6*  ALBUMIN 3.4* 2.2* 2.1*   No results for input(s): LIPASE, AMYLASE in the last 168 hours.  Recent Labs Lab 10/13/15 1145  AMMONIA 34    CBC:  Recent Labs Lab 10/13/2015 1928  10/11/15 0400 10/11/15 0420 10/12/15 0450 10/13/15 0356 10/14/15 0430  WBC 8.9  --  7.8  --  6.6 7.9 10.4  NEUTROABS 3.9  --   --   --   --   --   --   HGB 13.1  < > 12.6* 13.6 12.0* 12.0* 11.6*  HCT 41.1  < > 38.4* 40.0 37.3* 36.5* 37.1*  MCV 94.3  --  90.6  --  91.0 93.4 95.4  PLT 192  --  173  --  167 168 158  < > = values in this interval not displayed.  Cardiac Enzymes:  Recent Labs Lab 10/13/2015 2200 10/11/15 0400 10/11/15 1105 10/11/15  1600  TROPONINI 0.07* 0.29* 0.34* 0.38*    Lipid Panel: No results for input(s): CHOL, TRIG, HDL, CHOLHDL, VLDL, LDLCALC in the last 168 hours.  CBG:  Recent Labs Lab 10/13/15 2331 10/14/15 0321 10/14/15 0831 10/14/15 1119 10/14/15 1614  GLUCAP 106* 111* 117* 115* 108*    Microbiology: Results for orders placed or performed during the hospital encounter of 10/22/15  MRSA PCR Screening     Status: None   Collection Time: 10/22/15 10:28 PM  Result Value Ref Range Status   MRSA by PCR NEGATIVE NEGATIVE Final    Comment:        The GeneXpert MRSA Assay (FDA approved for NASAL specimens only), is one component of a comprehensive MRSA colonization surveillance program. It is not intended to diagnose MRSA infection nor to guide or monitor treatment for MRSA infections.   Culture, Urine     Status: None   Collection Time: 10/11/15 11:02 PM  Result Value Ref Range Status   Specimen Description URINE, CATHETERIZED  Final   Special Requests Normal  Final   Culture NO GROWTH 1 DAY  Final   Report  Status 10/14/2015 FINAL  Final  Culture, blood (routine x 2)     Status: None (Preliminary result)   Collection Time: 10/11/15 11:15 PM  Result Value Ref Range Status   Specimen Description BLOOD LEFT ANTECUBITAL  Final   Special Requests IN PEDIATRIC BOTTLE 1CC  Final   Culture NO GROWTH 2 DAYS  Final   Report Status PENDING  Incomplete  Culture, blood (routine x 2)     Status: None (Preliminary result)   Collection Time: 10/11/15 11:33 PM  Result Value Ref Range Status   Specimen Description BLOOD RIGHT HAND  Final   Special Requests IN PEDIATRIC BOTTLE 1CC  Final   Culture NO GROWTH 2 DAYS  Final   Report Status PENDING  Incomplete  Culture, respiratory (NON-Expectorated)     Status: None (Preliminary result)   Collection Time: 10/12/15  1:10 AM  Result Value Ref Range Status   Specimen Description TRACHEAL ASPIRATE  Final   Special Requests Normal  Final   Gram Stain   Final    ABUNDANT WBC PRESENT,BOTH PMN AND MONONUCLEAR RARE SQUAMOUS EPITHELIAL CELLS PRESENT ABUNDANT GRAM NEGATIVE COCCOBACILLI RARE GRAM POSITIVE COCCI IN PAIRS Performed at [facility]    Culture   Final    Culture reincubated for better growth Performed at [facility]    Report Status PENDING  Incomplete    Coagulation Studies: No results for input(s): LABPROT, INR in the last 72 hours.  Imaging: Dg Chest Port 1 View  10/14/2015  CLINICAL DATA:  Acute respiratory failure. EXAM: PORTABLE CHEST 1 VIEW COMPARISON:  10/13/2015. FINDINGS: Stable support apparatus. Cardiomegaly. Increasing RIGHT pleural effusion. Bibasilar opacities stable to slightly increased. No pneumothorax. IMPRESSION: Worsening aeration.  Increasing RIGHT pleural effusion. Electronically Signed   By: Elsie Stain M.D.   On: 10/14/2015 08:47   Dg Chest Port 1 View  10/13/2015  CLINICAL DATA:  Respiratory distress. EXAM: PORTABLE CHEST 1 VIEW COMPARISON:  10/12/2015. FINDINGS: Support apparatus stable. ETT in good  position 6 cm above carina. RIGHT upper lobe atelectasis is improved. BILATERAL pleural effusions, RIGHT greater than LEFT for cyst. Mild vascular congestion. IMPRESSION: Improved aeration, with apparent clearing of suspected mucous plugging obstructing the RIGHT upper lobe bronchus. Electronically Signed   By: Elsie Stain M.D.   On: 10/13/2015 09:49    Medications:   Current  facility-administered medications:  .  0.9 %  sodium chloride infusion, , Intravenous, Continuous, Jeanella Craze, NP, Last Rate: 10 mL/hr at 10/14/15 0700 .  0.9 %  sodium chloride infusion, , Intravenous, Continuous, Jeanella Craze, NP, Stopped at 10/14/15 1200 .  acetaminophen (TYLENOL) tablet 650 mg, 650 mg, Oral, Q4H PRN, Lennette Bihari, MD .  antiseptic oral rinse solution (CORINZ), 7 mL, Mouth Rinse, 10 times per day, Roslynn Amble, MD, 7 mL at 10/14/15 1604 .  artificial tears (LACRILUBE) ophthalmic ointment 1 application, 1 application, Both Eyes, 3 times per day, Rahul P Desai, PA-C, 1 application at 10/14/15 1333 .  aspirin chewable tablet 81 mg, 81 mg, Oral, Daily, Lennette Bihari, MD, 81 mg at 10/14/15 1005 .  atorvastatin (LIPITOR) tablet 80 mg, 80 mg, Oral, q1800, Lennette Bihari, MD, 80 mg at 10/14/15 1702 .  chlorhexidine gluconate (PERIDEX) 0.12 % solution 15 mL, 15 mL, Mouth Rinse, BID, Roslynn Amble, MD, 15 mL at 10/14/15 0800 .  dextrose 10 % infusion, , Intravenous, Continuous PRN, Roslynn Amble, MD .  feeding supplement (VITAL HIGH PROTEIN) liquid 1,000 mL, 1,000 mL, Per Tube, Q24H, Jeanella Craze, NP, 1,000 mL at 10/14/15 1347 .  fentaNYL (SUBLIMAZE) 2,500 mcg in sodium chloride 0.9 % 250 mL (10 mcg/mL) infusion, 25-400 mcg/hr, Intravenous, Continuous, Rahul P Desai, PA-C, Last Rate: 40 mL/hr at 10/14/15 1242, 400 mcg/hr at 10/14/15 1242 .  fentaNYL (SUBLIMAZE) bolus via infusion 25 mcg, 25 mcg, Intravenous, Q30 min PRN, Rahul P Desai, PA-C .  free water 200 mL, 200 mL, Per Tube, 3 times per  day, Lupita Leash, MD, 200 mL at 10/14/15 1400 .  heparin ADULT infusion 100 units/mL (25000 units/250 mL), 1,100 Units/hr, Intravenous, Continuous, Veronda P Bryk, RPH, Last Rate: 11 mL/hr at 10/14/15 0700, 1,100 Units/hr at 10/14/15 0700 .  insulin aspart (novoLOG) injection 2-6 Units, 2-6 Units, Subcutaneous, 6 times per day, Roslynn Amble, MD, 2 Units at 10/13/15 1642 .  insulin glargine (LANTUS) injection 10 Units, 10 Units, Subcutaneous, Q24H, Roslynn Amble, MD, 10 Units at 10/13/15 2200 .  insulin regular (NOVOLIN R,HUMULIN R) 250 Units in sodium chloride 0.9 % 250 mL (1 Units/mL) infusion, , Intravenous, Continuous, Lennette Bihari, MD, Stopped at 10/11/15 1900 .  lacosamide (VIMPAT) 100 mg in sodium chloride 0.9 % 25 mL IVPB, 100 mg, Intravenous, Q12H, Noel Christmas, 100 mg at 10/14/15 1005 .  levETIRAcetam (KEPPRA) 1,500 mg in sodium chloride 0.9 % 100 mL IVPB, 1,500 mg, Intravenous, Q12H, Noel Christmas, 1,500 mg at 10/14/15 0805 .  midazolam (VERSED) 250 mg in sodium chloride 0.9 % 250 mL (1 mg/mL) infusion, 100 mg/hr, Intravenous, Continuous, Kimberly B Hammons, RPH, Last Rate: 100 mL/hr at 10/14/15 1449, 100 mg/hr at 10/14/15 1449 .  midazolam (VERSED) bolus via infusion 1 mg, 1 mg, Intravenous, Q30 min PRN, Rahul P Desai, PA-C .  norepinephrine (LEVOPHED) 16 mg in dextrose 5 % 250 mL (0.064 mg/mL) infusion, 0-50 mcg/min, Intravenous, Titrated, Alyson Reedy, MD, Last Rate: 14.1 mL/hr at 10/14/15 1126, 15 mcg/min at 10/14/15 1126 .  ondansetron (ZOFRAN) injection 4 mg, 4 mg, Intravenous, Q6H PRN, Lennette Bihari, MD .  pantoprazole sodium (PROTONIX) 40 mg/20 mL oral suspension 40 mg, 40 mg, Per Tube, Daily, Sherron Monday, RPH, 40 mg at 10/14/15 1005 .  propofol (DIPRIVAN) 1000 MG/100ML infusion, 5-70 mcg/kg/min, Intravenous, Continuous, Alyson Reedy, MD, Last Rate: 39.1 mL/hr at 10/14/15 1631,  70 mcg/kg/min at 10/14/15 1631 .  sodium chloride flush (NS) 0.9 % injection  10-40 mL, 10-40 mL, Intracatheter, Q12H, Roslynn AmbleJennings E Nestor, MD, 10 mL at 10/14/15 1006 .  sodium chloride flush (NS) 0.9 % injection 10-40 mL, 10-40 mL, Intracatheter, PRN, Roslynn AmbleJennings E Nestor, MD .  valproate (DEPACON) 500 mg in dextrose 5 % 50 mL IVPB, 500 mg, Intravenous, 3 times per day, Caryl PinaEric Ciarah Peace, MD, 500 mg at 10/14/15 1333   Impression:  1. Status epilepticus S/P cardiac arrest. Currently on Versed and propofol gtt. Epileptiform discharges on bedside EEG monitor have decreased significantly in duration and frequency. Intervening periods of electrocerebral silence consistent with burst suppression. On scheduled IV Keppra, Vimpat and Depakote. 2. 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 Flatline EEG between bursts implies a poor prognosis. Will continue to monitor with continuous EEG.  3. Valproic acid level of 41.   Recommendations 1. Continue scheduled scheduled IV Keppra and Vimpat at current doses. 2. Increase Depakote to 750 mg IV TID.   2. Continue propofol gtt.  3. Increased Versed gtt by 50% to 100 mg/hr.  4. Follow LFTs.  Caryl PinaEric Kartik Fernando, MD 10/14/2015, 5:11 PM

## 2015-10-14 NOTE — Progress Notes (Signed)
ANTICOAGULATION CONSULT NOTE - Follow Up Consult  Pharmacy Consult for heparin Indication: IABP   Labs:  Recent Labs  10/11/15 1105 10/11/15 1600  10/12/15 0450 10/12/15 1030  10/12/15 2330 10/13/15 0350 10/13/15 0356 10/14/15 0430  HGB  --   --   < > 12.0*  --   --   --   --  12.0* 11.6*  HCT  --   --   --  37.3*  --   --   --   --  36.5* 37.1*  PLT  --   --   --  167  --   --   --   --  168 158  HEPARINUNFRC 0.39  --   --  0.37  --   < > 0.27* 0.22*  --  0.17*  CREATININE 0.87  --   < > 0.76 0.91  --   --   --  1.19 1.28*  TROPONINI 0.34* 0.38*  --   --   --   --   --   --   --   --   < > = values in this interval not displayed.    Assessment: 65yo male now subtherapeutic on heparin after several levels at goal though had been trending down.  Goal of Therapy:  Heparin level 0.2-0.5 units/ml   Plan:  Will increase heparin gtt by 1 unit/kg/hr to 1100 units/hr and check level in 6hr.  Clarence Brooks, PharmD, BCPS  10/14/2015,6:44 AM

## 2015-10-14 NOTE — Progress Notes (Signed)
Continuous EEG monitor stopped recording.  Neuro MD notified & EEG tech to come assess. [place], Clarence HansenJessica Brooks

## 2015-10-15 ENCOUNTER — Inpatient Hospital Stay (HOSPITAL_COMMUNITY): Payer: Medicare Other

## 2015-10-15 DIAGNOSIS — I6782 Cerebral ischemia: Secondary | ICD-10-CM

## 2015-10-15 DIAGNOSIS — G931 Anoxic brain damage, not elsewhere classified: Secondary | ICD-10-CM | POA: Insufficient documentation

## 2015-10-15 DIAGNOSIS — I2129 ST elevation (STEMI) myocardial infarction involving other sites: Secondary | ICD-10-CM

## 2015-10-15 DIAGNOSIS — J9601 Acute respiratory failure with hypoxia: Secondary | ICD-10-CM

## 2015-10-15 DIAGNOSIS — J9600 Acute respiratory failure, unspecified whether with hypoxia or hypercapnia: Secondary | ICD-10-CM | POA: Insufficient documentation

## 2015-10-15 LAB — COMPREHENSIVE METABOLIC PANEL
ALBUMIN: 1.9 g/dL — AB (ref 3.5–5.0)
ALT: 67 U/L — AB (ref 17–63)
AST: 47 U/L — AB (ref 15–41)
Alkaline Phosphatase: 108 U/L (ref 38–126)
Anion gap: 12 (ref 5–15)
BILIRUBIN TOTAL: 0.4 mg/dL (ref 0.3–1.2)
BUN: 15 mg/dL (ref 6–20)
CO2: 17 mmol/L — ABNORMAL LOW (ref 22–32)
CREATININE: 1.77 mg/dL — AB (ref 0.61–1.24)
Calcium: 7.9 mg/dL — ABNORMAL LOW (ref 8.9–10.3)
Chloride: 122 mmol/L — ABNORMAL HIGH (ref 101–111)
GFR calc Af Amer: 45 mL/min — ABNORMAL LOW (ref >60)
GFR, EST NON AFRICAN AMERICAN: 39 mL/min — AB (ref >60)
GLUCOSE: 137 mg/dL — AB (ref 65–99)
Potassium: 4.3 mmol/L (ref 3.5–5.1)
Sodium: 151 mmol/L — ABNORMAL HIGH (ref 135–145)
TOTAL PROTEIN: 5.6 g/dL — AB (ref 6.5–8.1)

## 2015-10-15 LAB — GLUCOSE, CAPILLARY
GLUCOSE-CAPILLARY: 126 mg/dL — AB (ref 65–99)
GLUCOSE-CAPILLARY: 112 mg/dL — AB (ref 65–99)
Glucose-Capillary: 137 mg/dL — ABNORMAL HIGH (ref 65–99)
Glucose-Capillary: 117 mg/dL — ABNORMAL HIGH (ref 65–99)
Glucose-Capillary: 161 mg/dL — ABNORMAL HIGH (ref 65–99)

## 2015-10-15 LAB — POCT I-STAT 3, ART BLOOD GAS (G3+)
ACID-BASE DEFICIT: 12 mmol/L — AB (ref 0.0–2.0)
Acid-base deficit: 11 mmol/L — ABNORMAL HIGH (ref 0.0–2.0)
BICARBONATE: 15.8 meq/L — AB (ref 20.0–24.0)
Bicarbonate: 15.8 mEq/L — ABNORMAL LOW (ref 20.0–24.0)
O2 Saturation: 85 %
O2 Saturation: 97 %
PCO2 ART: 38.3 mmHg (ref 35.0–45.0)
PH ART: 7.178 — AB (ref 7.350–7.450)
PH ART: 7.231 — AB (ref 7.350–7.450)
Patient temperature: 38.4
TCO2: 17 mmol/L (ref 0–100)
TCO2: 17 mmol/L (ref 0–100)
pCO2 arterial: 43.3 mmHg (ref 35.0–45.0)
pO2, Arterial: 67 mmHg — ABNORMAL LOW (ref 80.0–100.0)
pO2, Arterial: 114 mmHg — ABNORMAL HIGH (ref 80.0–100.0)

## 2015-10-15 LAB — CBC
HEMATOCRIT: 37.2 % — AB (ref 39.0–52.0)
HEMOGLOBIN: 11.5 g/dL — AB (ref 13.0–17.0)
MCH: 30 pg (ref 26.0–34.0)
MCHC: 30.9 g/dL (ref 30.0–36.0)
MCV: 97.1 fL (ref 78.0–100.0)
Platelets: 143 10*3/uL — ABNORMAL LOW (ref 150–400)
RBC: 3.83 MIL/uL — AB (ref 4.22–5.81)
RDW: 14.6 % (ref 11.5–15.5)
WBC: 8.9 10*3/uL (ref 4.0–10.5)

## 2015-10-15 LAB — TRIGLYCERIDES: TRIGLYCERIDES: 345 mg/dL — AB (ref <150)

## 2015-10-15 LAB — VALPROIC ACID LEVEL: VALPROIC ACID LVL: 63 ug/mL (ref 50.0–100.0)

## 2015-10-15 LAB — HEPARIN LEVEL (UNFRACTIONATED): Heparin Unfractionated: 0.4 IU/mL (ref 0.30–0.70)

## 2015-10-15 MED ORDER — VALPROATE SODIUM 500 MG/5ML IV SOLN
500.0000 mg | Freq: Four times a day (QID) | INTRAVENOUS | Status: DC
  Administered 2015-10-15 – 2015-10-18 (×11): 500 mg via INTRAVENOUS
  Filled 2015-10-15 (×17): qty 5

## 2015-10-15 MED ORDER — PHENYLEPHRINE HCL 10 MG/ML IJ SOLN
30.0000 ug/min | INTRAVENOUS | Status: DC
  Administered 2015-10-15: 160 ug/min via INTRAVENOUS
  Administered 2015-10-15: 30 ug/min via INTRAVENOUS
  Administered 2015-10-16: 60 ug/min via INTRAVENOUS
  Administered 2015-10-16: 140 ug/min via INTRAVENOUS
  Administered 2015-10-16: 100 ug/min via INTRAVENOUS
  Administered 2015-10-17: 80 ug/min via INTRAVENOUS
  Administered 2015-10-17: 150 ug/min via INTRAVENOUS
  Administered 2015-10-17: 120 ug/min via INTRAVENOUS
  Administered 2015-10-18: 200 ug/min via INTRAVENOUS
  Administered 2015-10-18: 160 ug/min via INTRAVENOUS
  Administered 2015-10-18 (×4): 200 ug/min via INTRAVENOUS
  Filled 2015-10-15 (×13): qty 4

## 2015-10-15 MED ORDER — VITAL AF 1.2 CAL PO LIQD
1000.0000 mL | ORAL | Status: DC
  Administered 2015-10-15 – 2015-10-16 (×3): 1000 mL
  Filled 2015-10-15: qty 1000

## 2015-10-15 MED ORDER — VASOPRESSIN 20 UNIT/ML IV SOLN
0.0300 [IU]/min | INTRAVENOUS | Status: DC
  Administered 2015-10-15 – 2015-10-18 (×4): 0.03 [IU]/min via INTRAVENOUS
  Filled 2015-10-15 (×4): qty 2

## 2015-10-15 MED ORDER — FUROSEMIDE 10 MG/ML IJ SOLN
40.0000 mg | Freq: Once | INTRAMUSCULAR | Status: AC
  Administered 2015-10-15: 40 mg via INTRAVENOUS
  Filled 2015-10-15: qty 4

## 2015-10-15 MED ORDER — SODIUM CHLORIDE 0.9 % IV SOLN
2000.0000 mg | Freq: Two times a day (BID) | INTRAVENOUS | Status: DC
  Administered 2015-10-15 – 2015-10-18 (×6): 2000 mg via INTRAVENOUS
  Filled 2015-10-15 (×9): qty 20

## 2015-10-15 MED ORDER — PHENYTOIN SODIUM 50 MG/ML IJ SOLN
100.0000 mg | Freq: Three times a day (TID) | INTRAMUSCULAR | Status: DC
  Administered 2015-10-15 – 2015-10-18 (×8): 100 mg via INTRAVENOUS
  Filled 2015-10-15 (×10): qty 2

## 2015-10-15 MED ORDER — SODIUM CHLORIDE 0.9 % IV SOLN
Freq: Once | INTRAVENOUS | Status: AC
  Administered 2015-10-15: 13:00:00 via INTRAVENOUS

## 2015-10-15 MED ORDER — SODIUM CHLORIDE 0.9 % IV SOLN
1500.0000 mg | Freq: Once | INTRAVENOUS | Status: AC
  Administered 2015-10-15: 1500 mg via INTRAVENOUS
  Filled 2015-10-15: qty 30

## 2015-10-15 MED ORDER — PRO-STAT SUGAR FREE PO LIQD
30.0000 mL | Freq: Every day | ORAL | Status: DC
  Administered 2015-10-15 – 2015-10-16 (×2): 30 mL
  Filled 2015-10-15 (×2): qty 30

## 2015-10-15 MED ORDER — SODIUM CHLORIDE 0.9 % IV SOLN
200.0000 mg | Freq: Two times a day (BID) | INTRAVENOUS | Status: DC
Start: 2015-10-15 — End: 2015-10-18
  Administered 2015-10-15 – 2015-10-18 (×6): 200 mg via INTRAVENOUS
  Filled 2015-10-15 (×10): qty 20

## 2015-10-15 MED ORDER — DEXTROSE 5 % IV SOLN
2.0000 g | INTRAVENOUS | Status: DC
  Administered 2015-10-15 – 2015-10-18 (×4): 2 g via INTRAVENOUS
  Filled 2015-10-15 (×5): qty 2

## 2015-10-15 NOTE — Progress Notes (Signed)
Pt's HR sustaining 100-110, slight fever of 100.8 noted. PRN tylenol given with no change in fever.  Elink consulted regarding these changes in VS. Will continue to monitor and intervene as appropriate.  Stanford Breedaroline A Kavonte Bearse, RN 10/15/2015 4:04 PM

## 2015-10-15 NOTE — Progress Notes (Signed)
Nutrition Follow-up  INTERVENTION:   D/C Vital High Protein  Initiate Vital AF 1.2 @ 70 ml/hr via OG tube.   30 ml Prostat daily.    Tube feeding regimen provides 2116 kcal (97% of needs), 141 grams of protein, and 1362 ml of H2O.   NUTRITION DIAGNOSIS:   Inadequate oral intake related to inability to eat as evidenced by NPO status. Ongoing.   GOAL:   Patient will meet greater than or equal to 90% of their needs Progressing.   MONITOR:   TF tolerance, Vent status, Labs, I & O's  REASON FOR ASSESSMENT:   Consult Enteral/tube feeding initiation and management  ASSESSMENT:   65 y.o. male with PMH as outlined below including HTN, HLD, DM2, obesity. [facility] via EMS after a witnessed out of hospital VF arrest with EKG revealing anterolateral STEMI.  Per MD note pt with poor prognosis with severe anoxic brain injury, turned off sedation and plan to have family meeting tomorrow.   Patient is currently intubated on ventilator support MV: 10.8 L/min Temp (24hrs), Avg:97.5 F (36.4 C), Min:95.5 F (35.3 C), Max:100.6 F (38.1 C)  Propofol: stopped at 1300 Free water: 200 ml TID = 600 ml Labs reviewed: sodium elevated 151 CBG's: 117-137  Diet Order:    NPO  Skin:  Reviewed, no issues  Last BM:  unknown  Height:   Ht Readings from Last 1 Encounters:  10/04/2015 6' (1.829 m)    Weight:   Wt Readings from Last 1 Encounters:  10/15/15 221 lb 5.5 oz (100.4 kg)    Ideal Body Weight:  80.9 kg  BMI:  Body mass index is 30.01 kg/(m^2).  Estimated Nutritional Needs:   Kcal:  2183  Protein:  >/= 140 grams  Fluid:  >/= 1.6 L  EDUCATION NEEDS:   No education needs identified at this time  Kendell BaneHeather Sasha Rogel RD, LDN, CNSC [phone] Pager [phone] After Hours Pager

## 2015-10-15 NOTE — Progress Notes (Signed)
Interval History:                                                                                                                      Clarence Brooks is an 65 y.o. male patient with  severe anoxic encephalopathy, post V. fib cardiac arrest. EEG this morning showed severe background suppression with no discernible cerebral activity noted. He is on high doses of sedative medications including propofol 40 mcg/Kg/minute, fentanyl GTT, and Versed GTT at 100 mg/hr.  No family at bedside.    Past Medical History: Past Medical History  Diagnosis Date  . Diabetes mellitus without complication (HCC)   . Hypertension   . Dyslipidemia   . Dizziness   . Anxiety   . Osteoarthritis   . Special screening for malignant neoplasm of prostate   . Encounter for long-term (current) use of other medications     Past Surgical History  Procedure Laterality Date  . Knee surgery      bilateral  . Umbilical hernia repair    . Carpal tunnel release      bilateral  . Ankle surgery    . Cardiac catheterization N/A 11/07/15    Procedure: Left Heart Cath and Coronary Angiography;  Surgeon: Lennette Bihari, MD;  Location: [facility] INVASIVE CV LAB;  Service: Cardiovascular;  Laterality: N/A;  . Cardiac catheterization N/A 07-Nov-2015    Procedure: IABP Insertion;  Surgeon: Lennette Bihari, MD;  Location: [facility] INVASIVE CV LAB;  Service: Cardiovascular;  Laterality: N/A;    Family History: Family History  Problem Relation Age of Onset  . Hypertension Mother   . Cancer Father     Throat  . Hypertension Brother     Social History:   reports that he has never smoked. He does not have any smokeless tobacco history on file. His alcohol and drug histories are not on file.  Allergies:  Allergies  Allergen Reactions  . Lisinopril Swelling and Other (See Comments)    Throat swelling   . Ace Inhibitors Swelling     Medications:                                                                                                                          Current facility-administered medications:  .  0.9 %  sodium chloride infusion, , Intravenous, Continuous, Jeanella Craze, NP, Last Rate: 999 mL/hr at 10/15/15 1300, 500 mL at 10/15/15 1300 .  0.9 %  sodium chloride infusion, ,  Intravenous, Continuous, Jeanella Craze, NP, Stopped at 10/14/15 1200 .  acetaminophen (TYLENOL) tablet 650 mg, 650 mg, Oral, Q4H PRN, Lennette Bihari, MD, 650 mg at 10/15/15 1400 .  antiseptic oral rinse solution (CORINZ), 7 mL, Mouth Rinse, 10 times per day, Roslynn Amble, MD, 7 mL at 10/15/15 1600 .  artificial tears (LACRILUBE) ophthalmic ointment 1 application, 1 application, Both Eyes, 3 times per day, Rahul P Desai, PA-C, 1 application at 10/15/15 1400 .  aspirin chewable tablet 81 mg, 81 mg, Oral, Daily, Lennette Bihari, MD, 81 mg at 10/15/15 1000 .  atorvastatin (LIPITOR) tablet 80 mg, 80 mg, Oral, q1800, Lennette Bihari, MD, 80 mg at 10/15/15 1800 .  cefTRIAXone (ROCEPHIN) 2 g in dextrose 5 % 50 mL IVPB, 2 g, Intravenous, Q24H, Jose Angelo A Christene Slates, MD, 2 g at 10/15/15 1100 .  chlorhexidine gluconate (PERIDEX) 0.12 % solution 15 mL, 15 mL, Mouth Rinse, BID, Roslynn Amble, MD, 15 mL at 10/15/15 0800 .  dextrose 10 % infusion, , Intravenous, Continuous PRN, Roslynn Amble, MD .  feeding supplement (PRO-STAT SUGAR FREE 64) liquid 30 mL, 30 mL, Per Tube, Daily, Jose Angelo A de Lucy Chris, MD, 30 mL at 10/15/15 1500 .  feeding supplement (VITAL AF 1.2 CAL) liquid 1,000 mL, 1,000 mL, Per Tube, Continuous, Jose Angelo A Christene Slates, MD, Last Rate: 70 mL/hr at 10/15/15 1800, 1,000 mL at 10/15/15 1800 .  fentaNYL (SUBLIMAZE) 2,500 mcg in sodium chloride 0.9 % 250 mL (10 mcg/mL) infusion, 25-400 mcg/hr, Intravenous, Continuous, Rahul P Desai, PA-C, Stopped at 10/15/15 1300 .  fentaNYL (SUBLIMAZE) bolus via infusion 25 mcg, 25 mcg, Intravenous, Q30 min PRN, Rahul P Desai, PA-C .  free water 200 mL, 200 mL, Per Tube, 3 times per day, Lupita Leash,  MD, 200 mL at 10/15/15 0546 .  heparin ADULT infusion 100 units/mL (25000 units/250 mL), 1,100 Units/hr, Intravenous, Continuous, Veronda P Bryk, RPH, Last Rate: 11 mL/hr at 10/15/15 1600, 1,100 Units/hr at 10/15/15 1600 .  insulin aspart (novoLOG) injection 2-6 Units, 2-6 Units, Subcutaneous, 6 times per day, Roslynn Amble, MD, 2 Units at 10/15/15 [phone] .  insulin glargine (LANTUS) injection 10 Units, 10 Units, Subcutaneous, Q24H, Roslynn Amble, MD, 10 Units at 10/14/15 2206 .  insulin regular (NOVOLIN R,HUMULIN R) 250 Units in sodium chloride 0.9 % 250 mL (1 Units/mL) infusion, , Intravenous, Continuous, Lennette Bihari, MD, Stopped at 10/11/15 1900 .  lacosamide (VIMPAT) 200 mg in sodium chloride 0.9 % 25 mL IVPB, 200 mg, Intravenous, Q12H, Seerat Peaden Daniel Nones, MD .  levETIRAcetam (KEPPRA) 2,000 mg in sodium chloride 0.9 % 100 mL IVPB, 2,000 mg, Intravenous, Q12H, Flor Houdeshell Daniel Nones, MD .  midazolam (VERSED) 250 mg in sodium chloride 0.9 % 250 mL (1 mg/mL) infusion, 100 mg/hr, Intravenous, Continuous, Kimberly B Hammons, RPH, Stopped at 10/15/15 1300 .  midazolam (VERSED) bolus via infusion 1 mg, 1 mg, Intravenous, Q30 min PRN, Rahul P Desai, PA-C .  norepinephrine (LEVOPHED) 16 mg in dextrose 5 % 250 mL (0.064 mg/mL) infusion, 0-50 mcg/min, Intravenous, Titrated, Alyson Reedy, MD, Last Rate: 46.9 mL/hr at 10/15/15 1700, 50 mcg/min at 10/15/15 1700 .  ondansetron (ZOFRAN) injection 4 mg, 4 mg, Intravenous, Q6H PRN, Lennette Bihari, MD .  pantoprazole sodium (PROTONIX) 40 mg/20 mL oral suspension 40 mg, 40 mg, Per Tube, Daily, Sherron Monday, RPH, 40 mg at 10/15/15 1000 .  phenylephrine (NEO-SYNEPHRINE) 40 mg  in dextrose 5 % 250 mL (0.16 mg/mL) infusion, 30-200 mcg/min, Intravenous, Continuous, Jose Angelo A de Lucy Chrisios, MD, Last Rate: 52.5 mL/hr at 10/15/15 1815, 140 mcg/min at 10/15/15 1815 .  phenytoin (DILANTIN) injection 100 mg, 100 mg, Intravenous, 3 times per day, Sanyla Summey  Daniel NonesNarayan Kaveer Atzin Buchta, MD .  propofol (DIPRIVAN) 1000 MG/100ML infusion, 5-70 mcg/kg/min, Intravenous, Continuous, Alyson ReedyWesam G Yacoub, MD, Stopped at 10/15/15 1300 .  sodium chloride flush (NS) 0.9 % injection 10-40 mL, 10-40 mL, Intracatheter, Q12H, Roslynn AmbleJennings E Nestor, MD, 10 mL at 10/15/15 1000 .  sodium chloride flush (NS) 0.9 % injection 10-40 mL, 10-40 mL, Intracatheter, PRN, Roslynn AmbleJennings E Nestor, MD .  valproate (DEPACON) 500 mg in dextrose 5 % 50 mL IVPB, 500 mg, Intravenous, 4 times per day, Djuana Littleton Daniel NonesNarayan Kaveer Delicia Berens, MD .  vasopressin (PITRESSIN) 40 Units in sodium chloride 0.9 % 250 mL (0.16 Units/mL) infusion, 0.03 Units/min, Intravenous, Continuous, Jose Alexis FrockAngelo A de Dios, MD, Last Rate: 11.3 mL/hr at 10/15/15 1730, 0.03 Units/min at 10/15/15 1730   Neurologic Examination:                                                                                                     Today's Vitals   10/15/15 1600 10/15/15 1700 10/15/15 1748 10/15/15 1800  BP: 116/79 108/71  121/76  Pulse: 107 103    Temp: 100.8 F (38.2 C) 100.9 F (38.3 C)  101.5 F (38.6 C)  TempSrc: Core (Comment)     Resp: 18 0  0  Height:      Weight:      SpO2: 97% 96% 89%    He is intubated, sedated, pupils nonreactive,  no motor response to stimulation   Lab Results: Basic Metabolic Panel:  Recent Labs Lab 10/11/15 0400  10/11/15 1105  10/12/15 0450 10/12/15 1030 10/13/15 0356 10/14/15 0430 10/15/15 0415  NA 141  138  < > 141  < > 141 140 144 151* 151*  K 3.1*  3.0*  < > 2.8*  < > 3.6 3.5 3.6 3.8 4.3  CL 110  110  < > 112*  < > 115* 114* 117* 123* 122*  CO2 19*  18*  --  19*  < > 18* 18* 19* 19* 17*  GLUCOSE 258*  262*  < > 179*  < > 157* 156* 128* 126* 137*  BUN 13  13  < > 12  < > 10 10 9 7 15   CREATININE 1.01  1.01  < > 0.87  < > 0.76 0.91 1.19 1.28* 1.77*  CALCIUM 8.6*  8.6*  --  8.4*  < > 8.2* 8.2* 8.2* 8.2* 7.9*  MG 2.1  --  1.9  --  1.8  --  2.0  --   --   PHOS 1.6*  --   --   --  3.5   --  3.8  --   --   < > = values in this interval not displayed.  Liver Function Tests:  Recent Labs Lab 10/08/2015 1928 10/13/15 1054 10/14/15 0430 10/15/15 0415  AST  150* 48* 39 47*  ALT 166* 106* 85* 67*  ALKPHOS 72 77 93 108  BILITOT 0.8 0.5 0.4 0.4  PROT 7.2 5.4* 5.6* 5.6*  ALBUMIN 3.4* 2.2* 2.1* 1.9*   No results for input(s): LIPASE, AMYLASE in the last 168 hours.  Recent Labs Lab 10/13/15 1145  AMMONIA 34    CBC:  Recent Labs Lab 10-14-15 1928  10/11/15 0400 10/11/15 0420 10/12/15 0450 10/13/15 0356 10/14/15 0430 10/15/15 0415  WBC 8.9  --  7.8  --  6.6 7.9 10.4 8.9  NEUTROABS 3.9  --   --   --   --   --   --   --   HGB 13.1  < > 12.6* 13.6 12.0* 12.0* 11.6* 11.5*  HCT 41.1  < > 38.4* 40.0 37.3* 36.5* 37.1* 37.2*  MCV 94.3  --  90.6  --  91.0 93.4 95.4 97.1  PLT 192  --  173  --  167 168 158 143*  < > = values in this interval not displayed.  Cardiac Enzymes:  Recent Labs Lab 2015-10-14 2200 10/11/15 0400 10/11/15 1105 10/11/15 1600  TROPONINI 0.07* 0.29* 0.34* 0.38*    Lipid Panel:  Recent Labs Lab 10/15/15 0415  TRIG 345*    CBG:  Recent Labs Lab 10/14/15 2001 10/14/15 2330 10/15/15 0413 10/15/15 0825 10/15/15 1105  GLUCAP 103* 115* 126* 137* 117*    Microbiology: Results for orders placed or performed during the hospital encounter of 10-14-2015  MRSA PCR Screening     Status: None   Collection Time: Oct 14, 2015 10:28 PM  Result Value Ref Range Status   MRSA by PCR NEGATIVE NEGATIVE Final    Comment:        The GeneXpert MRSA Assay (FDA approved for NASAL specimens only), is one component of a comprehensive MRSA colonization surveillance program. It is not intended to diagnose MRSA infection nor to guide or monitor treatment for MRSA infections.   Culture, Urine     Status: None   Collection Time: 10/11/15 11:02 PM  Result Value Ref Range Status   Specimen Description URINE, CATHETERIZED  Final   Special Requests Normal   Final   Culture NO GROWTH 1 DAY  Final   Report Status 10/14/2015 FINAL  Final  Culture, blood (routine x 2)     Status: None (Preliminary result)   Collection Time: 10/11/15 11:15 PM  Result Value Ref Range Status   Specimen Description BLOOD LEFT ANTECUBITAL  Final   Special Requests IN PEDIATRIC BOTTLE 1CC  Final   Culture NO GROWTH 3 DAYS  Final   Report Status PENDING  Incomplete  Culture, blood (routine x 2)     Status: None (Preliminary result)   Collection Time: 10/11/15 11:33 PM  Result Value Ref Range Status   Specimen Description BLOOD RIGHT HAND  Final   Special Requests IN PEDIATRIC BOTTLE 1CC  Final   Culture NO GROWTH 3 DAYS  Final   Report Status PENDING  Incomplete  Culture, respiratory (NON-Expectorated)     Status: None   Collection Time: 10/12/15  1:10 AM  Result Value Ref Range Status   Specimen Description TRACHEAL ASPIRATE  Final   Special Requests Normal  Final   Gram Stain   Final    ABUNDANT WBC PRESENT,BOTH PMN AND MONONUCLEAR RARE SQUAMOUS EPITHELIAL CELLS PRESENT ABUNDANT GRAM NEGATIVE COCCOBACILLI RARE GRAM POSITIVE COCCI IN PAIRS Performed at [facility]    Culture   Final    ABUNDANT  HAEMOPHILUS INFLUENZAE Note: BETA LACTAMASE NEGATIVE FEW STREPTOCOCCUS GROUP C Note: Beta hemolytic streptococci are predictably susceptible to penicillin and other beta lactams. Susceptibility testing not routinely performed. Performed at [facility]    Report Status 10/14/2015 FINAL  Final    Imaging: Dg Chest Port 1 View  10/15/2015  CLINICAL DATA:  Acute on chronic respiratory failure, cardiac arrest, STEMI EXAM: PORTABLE CHEST 1 VIEW COMPARISON:  Portable chest x-ray of October 14, 2015 FINDINGS: The lungs are well-expanded. There are layering pleural effusions posteriorly greatest on the right. The retrocardiac region is dense. The cardiac silhouette is mildly enlarged. The pulmonary vascularity is engorged and indistinct. The endotracheal  tube tip lies approximately 4.6 cm above the carina. The esophagogastric tube tip projects below the inferior margin of the image. The right internal jugular venous catheter tip projects over the proximal portion of the SVC. An external pacemaker defibrillator pad is present. IMPRESSION: Worsening of CHF with bilateral pleural effusions, interstitial edema, and left lower lobe atelectasis. The support tubes are in reasonable position. Electronically Signed   By: David  [place] M.D.   On: 10/15/2015 07:17   Dg Chest Port 1 View  10/14/2015  CLINICAL DATA:  Acute respiratory failure. EXAM: PORTABLE CHEST 1 VIEW COMPARISON:  10/13/2015. FINDINGS: Stable support apparatus. Cardiomegaly. Increasing RIGHT pleural effusion. Bibasilar opacities stable to slightly increased. No pneumothorax. IMPRESSION: Worsening aeration.  Increasing RIGHT pleural effusion. Electronically Signed   By: Elsie Stain M.D.   On: 10/14/2015 08:47   Dg Chest Port 1 View  10/13/2015  CLINICAL DATA:  Respiratory distress. EXAM: PORTABLE CHEST 1 VIEW COMPARISON:  10/12/2015. FINDINGS: Support apparatus stable. ETT in good position 6 cm above carina. RIGHT upper lobe atelectasis is improved. BILATERAL pleural effusions, RIGHT greater than LEFT for cyst. Mild vascular congestion. IMPRESSION: Improved aeration, with apparent clearing of suspected mucous plugging obstructing the RIGHT upper lobe bronchus. Electronically Signed   By: Elsie Stain M.D.   On: 10/13/2015 09:49   Dg Chest Port 1 View  10/12/2015  CLINICAL DATA:  Check endotracheal tube placement EXAM: PORTABLE CHEST 1 VIEW COMPARISON:  10/01/2015 FINDINGS: Cardiac shadow is stable. An endotracheal tube is again seen approximately 5 cm above the carina. A right jugular central line and nasogastric catheter are again noted and stable. New increased density is noted in the right upper lobe consistent with consolidation likely related mucous plugging given the acuteness of the  abnormality. Right-sided pleural effusion is noted. The left lung remains clear. IMPRESSION: New right upper lobe consolidation likely related to mucous plugging. Electronically Signed   By: Alcide Clever M.D.   On: 10/12/2015 07:19    Assessment and plan:   Clarence Brooks is an 65 y.o. male patient with with severe anoxic brain injury secondary to V. fib cardiac arrest. Complete background suppression on the EEG is noted with no discernible brain activity. I recommend discontinuation of all sedative drips including fentanyl propofol and Versed at this time. After a few minutes, intermittent burst activity consisting of abnormal epileptiform discharges in the form of polyspike's or spike and waves morphology is noted, with prolonged 45 seconds of background suppression between these abnormal burst activity with no discernible normal cerebral activity in the background.  He is currently on several antiepileptic medications. Recommend increasing the dose of these medications to Vimpat 200 mg twice a day, Keppra 2000 mg twice a day, started IV phenytoin loading dose, with maintenance dose ordered, and Depakote 500 mg every 6 hours with  the hope to control the abnormal epileptiform burst activity.  Overall, the EEG is suggestive of severe anoxic brain injury, with very poor prognosis for neurological recovery.   No family is available at bedside. Nursing staff contacted his wife who said that she will be available tomorrow morning, 10/16/2015 at 10 AM for a family meeting to discuss his current neurological assessment and prognosis.   Discussed with ICU attending.  We'll follow-up

## 2015-10-15 NOTE — Progress Notes (Addendum)
PULMONARY / CRITICAL CARE MEDICINE   Name: Clarence Brooks MRN: [id number] DOB: February 01, 1951    ADMISSION DATE:  09/22/2015 CONSULTATION DATE:  09/22/2015  REFERRING MD:  Claiborne Billings  CHIEF COMPLAINT:  Cardiac Arrest  BRIEF Hx:   Clarence Brooks is a 65 y.o. male with PMH as outlined below including HTN, HLD, DM2, obesity.  He was brought to [facility] ED 03/22 via EMS after a witnessed out of hospital VF arrest with EKG revealing anterolateral STEMI.  CPR was apparently immediately started on the scene and on EMS arrival, he was given 2 rounds of epi as well as 1 defibrillation prior to ROSC.  He had king airway placed in the field and this was switched to ETT in ED.  On arrival to ED, he was met by cardiology team who took him for emergent cardiac cath. Per RN report, pt had severe 3 vessel disease that was not amenable to PCI.  He had IABP placed for EF of roughly 25% and will need CVTS input.  Following cath, he returned to the ICU and hypothermia protocol was initiated.  Pt with szes/status epilepticus over the weekend. Neuro managing sze meds.   SUBJECTIVE:  Szes better controlled with meds. (-) acute issues last 24 hrs. Sedated. Has IABP -- no issues noted.  VITAL SIGNS: BP 110/71 mmHg  Pulse 87  Temp(Src) 96.3 F (35.7 C) (Core (Comment))  Resp 18  Ht 6' (1.829 m)  Wt 221 lb 5.5 oz (100.4 kg)  BMI 30.01 kg/m2  SpO2 92%  HEMODYNAMICS: CVP:  [5 mmHg-12 mmHg] 11 mmHg  VENTILATOR SETTINGS: Vent Mode:  [-] PRVC FiO2 (%):  [40 %] 40 % Set Rate:  [18 bmp] 18 bmp Vt Set:  [620 mL] 620 mL PEEP:  [5 cmH20] 5 cmH20 Plateau Pressure:  [10 YOF18-86 cmH20] 10 cmH20  INTAKE / OUTPUT: I/O last 3 completed shifts: In: 8956.7 [I.V.:7062.3; NG/GT:1194.3; IV Piggyback:700] Out: 4625 [Urine:4625]  PHYSICAL EXAMINATION: General: Adult male, resting in bed, critically ill. (-) obvious szes noted.  Neuro: deep sedation, no response to stimuli HEENT: [place]/AT. PERRL, sclerae anicteric. Cardiovascular: RRR,  no M/R/G.  Lungs: Respirations even and unlabored. Crackles at bases Abdomen: BS x 4, soft, NT/ND.  Musculoskeletal: No gross deformities, no edema.  Skin: Intact, warm, no rashes.  LABS:  BMET  Recent Labs Lab 10/13/15 0356 10/14/15 0430 10/15/15 0415  NA 144 151* 151*  K 3.6 3.8 4.3  CL 117* 123* 122*  CO2 19* 19* 17*  BUN _0 CREATININE 1.19 1.28* 1.77*  GLUCOSE 128* 126* 137*    Electrolytes  Recent Labs Lab 10/11/15 0400 10/11/15 1105  10/12/15 0450  10/13/15 0356 10/14/15 0430 10/15/15 0415  CALCIUM 8.6*  8.6* 8.4*  < > 8.2*  < > 8.2* 8.2* 7.9*  MG 2.1 1.9  --  1.8  --  2.0  --   --   PHOS 1.6*  --   --  3.5  --  3.8  --   --   < > = values in this interval not displayed.  CBC  Recent Labs Lab 10/13/15 0356 10/14/15 0430 10/15/15 0415  WBC 7.9 10.4 8.9  HGB 12.0* 11.6* 11.5*  HCT 36.5* 37.1* 37.2*  PLT 168 158 143*    Coag's  Recent Labs Lab 10/07/2015 2200 10/11/15 0400  APTT 58* 62*  INR 1.20 1.18    Sepsis Markers  Recent Labs Lab 09/30/2015 1936 10/05/2015 2243 10/11/15 0500  LATICACIDVEN 7.67* 2.2* 2.5*  ABG  Recent Labs Lab 10/11/15 0335 10/12/15 0416 10/13/15 0410  PHART 7.468* 7.429 7.377  PCO2ART 24.2* 27.0* 32.5*  PO2ART 155* 82.7 95.1    Liver Enzymes  Recent Labs Lab 10/13/15 1054 10/14/15 0430 10/15/15 0415  AST 48* 39 47*  ALT 106* 85* 67*  ALKPHOS 77 93 108  BILITOT 0.5 0.4 0.4  ALBUMIN 2.2* 2.1* 1.9*    Cardiac Enzymes  Recent Labs Lab 10/11/15 0400 10/11/15 1105 10/11/15 1600  TROPONINI 0.29* 0.34* 0.38*    Glucose  Recent Labs Lab 10/14/15 0831 10/14/15 1119 10/14/15 1614 10/14/15 2001 10/14/15 2330 10/15/15 0413  GLUCAP 117* 115* 108* 103* 115* 126*    Imaging Dg Chest Port 1 View  10/15/2015  CLINICAL DATA:  Acute on chronic respiratory failure, cardiac arrest, STEMI EXAM: PORTABLE CHEST 1 VIEW COMPARISON:  Portable chest x-ray of October 14, 2015 FINDINGS: The lungs are  well-expanded. There are layering pleural effusions posteriorly greatest on the right. The retrocardiac region is dense. The cardiac silhouette is mildly enlarged. The pulmonary vascularity is engorged and indistinct. The endotracheal tube tip lies approximately 4.6 cm above the carina. The esophagogastric tube tip projects below the inferior margin of the image. The right internal jugular venous catheter tip projects over the proximal portion of the SVC. An external pacemaker defibrillator pad is present. IMPRESSION: Worsening of CHF with bilateral pleural effusions, interstitial edema, and left lower lobe atelectasis. The support tubes are in reasonable position. Electronically Signed   By: David  Martinique M.D.   On: 10/15/2015 07:17     STUDIES:  CXR 03/22 > no acute process. CXR 3/25 > images personally reviewed, R effusion ECHO 3/23 >> LVEF 20-25%, severe global hypokinesis, mod LVH, diastolic dysfunction, elevated filling pressure EEG 3/24 >> burst suppression pattern with electrographic seizures within bursts, pattern usually associated with poor outcome  CXR 3/27 >> worse CHF, more B effusion  CULTURES: BCx2 3/23 >> (-) Sputum 3/24 >> abundant H influenza UC 3/23 >> (-)  ANTIBIOTICS: None.  SIGNIFICANT EVENTS: 3/22  Admitted after out of hospital VF arrest.   3/25  Reached re-warming ~ 0200 3/26  Ongoing burst activity on EEG despite heavy (versed _0 , prop 70 mcg, fent 400 + AED's) sedation  LINES/TUBES: ETT 03/22 > R IJ CVL 3/22 > R rad A line 3/22 >   DISCUSSION: 65 y.o. M admitted 03/22 after out of hospital VF arrest.  He was taken to cath lab for emergent cardiac cath and  this revealed severe 3 vessel disease not amenable to PCI.  He had EF of 25% so IABP was placed. Following procedure, he returned to ICU where hypothermia protocol was initiated.  Re-warmed ~0200 3/25.  Szes over the weekend.   ASSESSMENT / PLAN:  CARDIOVASCULAR A:  Out of hospital VF arrest - s/p  cardiac cath with reported severe 3 vessel disease not amenable to PCI.  EF of 25% so IABP was placed sCHF - reported EF of ~25%, global hypokinesis, also with Diastolic Dysfxn  Severe 3 vessel CAD  Hx HTN, HLD P:  Continue normothermia protocol Pt needs diuresis once BP tolerates. 5L (+) with worse CHF on CXR Levophed as needed for MAP >80. BP dropped when sedation was discontinued >> BP better on levophed drip still. If it drops again, plan for neosynephrine drip.  Cardiology following, appreciate input Will need CVTS consult for CABG evaluation pending neuro prognostication Pt on IABP  1: 1.   NEUROLOGIC A:   Acute metabolic  encephalopathy - secondary to cardiac arrest. Status Epilepticus Hx anxiety P:   RASS goal: -5 with concern for seizures  Hold daily WUA with deep sedation for seizure On versed 100 mg/hr, vimpat, keppra, valproate, propofol. Szes controlled.  Consider pentobarbital.  >> Neuro said EEG with severe anoxic injury. Neuro turned off versed/fentanyl/propofol.  EEG as above Neurology following, appreciate input   PULMONARY A: Acute hypoxemic hypercarbic respiratory failure - hypercarbia resolved, due to respiratory insufficiency in the setting of cardiac arrest, pulm edema, possible CAP with H influenza in sputum.  Mucus Plugging - resolved.  P:   PRVC, 8 cc/kg Wean PEEP / FiO2 for sats > 92% VAP prevention measures Hold SBT given sedation for seizures Albuterol PRN CXR intermittently  Start rocephin for possible CAP. Plan for 7 days.   RENAL A:   AKI -- worsening Hypokalemia - anticipate worsening during hypothermia protocol Hypocalcemia Hyperchloremia - suspect 2/2 to NS administration AGMA - lactate  P:   Replace electrolytes as indicated Reduce NS to KVO Trend BMP / UOP  Creat climbing, making urine. Needs diuresis once BP tolerates.   GASTROINTESTINAL A:   GI prophylaxis Nutrition P:  SUP: Pantoprazole NPO  Cont TF  HEMATOLOGIC A:    Anemia - mild, no evidence of bleeding  VTE Prophylaxis P:  SCD's / heparin gtt per pharmacy  Trend CBC  INFECTIOUS A:   CAP with abundant H. Influenza in sputum  P:  Start Rocephin Monitor clinically Cultures as above   ENDOCRINE A:   DM  2 TSH wnl  P:   ICU hyperglycemia protocol Lantus 10 units Q24  I spoke with Neurology re: pt's case. Pt's EEG shows poor prognosis with severe anoxic injury. Neuro turned off versed/propofol/fentanyl. He wanted to have a family meeting tomorrow.   Family updated: No family bedside am 3/27.   Interdisciplinary Family Meeting scheduled for 3/28 10:30am.   I spent 33 minutes of Critical care time with this patient today.      Monica Becton, MD 10/15/2015, 9:17 AM [facility] Pager [phone] After 3 pm or if no answer, call [phone]

## 2015-10-15 NOTE — Progress Notes (Signed)
Per neurology MD will continue continuous EEG monitoring and sedation.   Stanford Breedaroline A Anavey Coombes, RN 10/15/2015 11:56 AM

## 2015-10-15 NOTE — Progress Notes (Addendum)
Per neurology MD propofol, versed, and fentanyl drips stopped at 13:00.   Pt became hypotensive shortly after sedation stopped and CCM made aware, awaiting orders. Pt currently stable. With a BP of 114/64.  Wife contacted to arrange a family meeting and discuss goals of care, meeting has been scheduled for 10/16/15 at 10:30 am. Confirmed meeting time and date with neurology and CCMD. Per CCM will also notify cardiology of meeting time.  Stanford Breedaroline A Makynzi Eastland, RN 10/15/2015 1:29 PM

## 2015-10-15 NOTE — Progress Notes (Addendum)
CSW received consult re: "Wife requires social work consult to manage bank account. Per wife I may share this information with you and she will be visiting this morning at 10:00"  Patient's wife has not arrived as of yet. CSW provided Patient's RN with information re: obtaining financial power of attorney. Patient is currently intubated and unresponsive and unable to consent to Financial POA. CSW will attempt to contact Patient's wife via T/C to further discuss options.   Noe GensAshley Gardner, MSW, LCSW [facility] Clinical Social Worker [phone]  1034: CSW attempted wife via T/C with no answer. Voicemail left. CSW will continue to follow.

## 2015-10-15 NOTE — Progress Notes (Signed)
eLink MD notified of abnormal ABG results and elevated temperature.  Vent settings changed to increase rate to 24 and Peep to 10. One time dose lasix given and will repeat ABG at 20:30.  PRN tylenol given for fever.  Will continue to monitor.   Stanford Breedaroline A Kourtni Stineman, RN 10/15/2015

## 2015-10-15 NOTE — Procedures (Signed)
A-line and dressing along with CVP line changed without complications.

## 2015-10-15 NOTE — Progress Notes (Signed)
eLink Physician-Brief Progress Note Patient Name: [name] DOB: [birth year] MRN: [id number]   Date of Service  10/15/2015  HPI/Events of Note  ABG on 100%/PRVC 10/TV 620/P 5 = 7.17/43/67.   eICU Interventions  Will order: 1. Increase PEEP to 10 and increase PRVC rate to 24. 2. Repeat ABG at 8:30 PM.     Intervention Category Major Interventions: Respiratory failure - evaluation and management  Coutney Wildermuth Eugene 10/15/2015, 6:54 PM

## 2015-10-15 NOTE — Progress Notes (Addendum)
Cardiologist: Dr. Percival Spanish Subjective:   Out of hospital VF arrest anterolateral STEMI, triple-vessel disease, EF 25%.  Seizures may have improved according to neurology/critical care note  Objective:  Vital Signs in the last 24 hours: Temp:  [95.5 F (35.3 C)-98.6 F (37 C)] 97 F (36.1 C) (03/27 0900) Pulse Rate:  [74-93] 91 (03/27 0900) Resp:  [16-36] 18 (03/27 0900) BP: (91-121)/(44-74) 97/46 mmHg (03/27 0900) SpO2:  [90 %-95 %] 93 % (03/27 0900) Arterial Line BP: (90-131)/(41-75) 97/46 mmHg (03/27 0900) FiO2 (%):  [40 %] 40 % (03/27 0800) Weight:  [220 lb 14.4 oz (100.2 kg)-221 lb 5.5 oz (100.4 kg)] 221 lb 5.5 oz (100.4 kg) (03/27 0600)  Intake/Output from previous day: 03/26 0701 - 03/27 0700 In: 6440.2 [I.V.:4805.9; FU/XN:2355.7; IV Piggyback:440] Out: 2185 [Urine:2185]   Physical Exam: General: Sedate, on vent, in no acute distress. Head:  Normocephalic and atraumatic. Ventilator Lungs: Clear to auscultation and percussion. Heart: Normal S1 and S2.  No murmur, rubs or gallops.  Abdomen: soft, non-tender, positive bowel sounds. Extremities: No clubbing or cyanosis. No edema. Balloon pump Neurologic: Sedate   Lab Results:  Recent Labs  10/14/15 0430 10/15/15 0415  WBC 10.4 8.9  HGB 11.6* 11.5*  PLT 158 143*    Recent Labs  10/14/15 0430 10/15/15 0415  NA 151* 151*  K 3.8 4.3  CL 123* 122*  CO2 19* 17*  GLUCOSE 126* 137*  BUN 7 15  CREATININE 1.28* 1.77*    Recent Labs  10/13/15 1054  10/15/15 0415  PROT 5.4*  < > 5.6*  ALBUMIN 2.2*  < > 1.9*  AST 48*  < > 47*  ALT 106*  < > 67*  ALKPHOS 77  < > 108  BILITOT 0.5  < > 0.4  BILIDIR 0.2  --   --   IBILI 0.3  --   --   < > = values in this interval not displayed. No results for input(s): CHOL in the last 72 hours. No results for input(s): PROTIME in the last 72 hours.  Imaging: Dg Chest Port 1 View  10/15/2015  CLINICAL DATA:  Acute on chronic respiratory failure, cardiac arrest,  STEMI EXAM: PORTABLE CHEST 1 VIEW COMPARISON:  Portable chest x-ray of October 14, 2015 FINDINGS: The lungs are well-expanded. There are layering pleural effusions posteriorly greatest on the right. The retrocardiac region is dense. The cardiac silhouette is mildly enlarged. The pulmonary vascularity is engorged and indistinct. The endotracheal tube tip lies approximately 4.6 cm above the carina. The esophagogastric tube tip projects below the inferior margin of the image. The right internal jugular venous catheter tip projects over the proximal portion of the SVC. An external pacemaker defibrillator pad is present. IMPRESSION: Worsening of CHF with bilateral pleural effusions, interstitial edema, and left lower lobe atelectasis. The support tubes are in reasonable position. Electronically Signed   By: David  Martinique M.D.   On: 10/15/2015 07:17   Dg Chest Port 1 View  10/14/2015  CLINICAL DATA:  Acute respiratory failure. EXAM: PORTABLE CHEST 1 VIEW COMPARISON:  10/13/2015. FINDINGS: Stable support apparatus. Cardiomegaly. Increasing RIGHT pleural effusion. Bibasilar opacities stable to slightly increased. No pneumothorax. IMPRESSION: Worsening aeration.  Increasing RIGHT pleural effusion. Electronically Signed   By: Staci Righter M.D.   On: 10/14/2015 08:47   Personally viewed.   Telemetry: No adverse arrhythmias Personally viewed.    Meds: Scheduled Meds: . antiseptic oral rinse  7 mL Mouth Rinse 10 times per day  .  artificial tears  1 application Both Eyes 3 times per day  . aspirin  81 mg Oral Daily  . atorvastatin  80 mg Oral q1800  . chlorhexidine gluconate (SAGE KIT)  15 mL Mouth Rinse BID  . feeding supplement (VITAL HIGH PROTEIN)  1,000 mL Per Tube Q24H  . free water  200 mL Per Tube 3 times per day  . insulin aspart  2-6 Units Subcutaneous 6 times per day  . insulin glargine  10 Units Subcutaneous Q24H  . lacosamide (VIMPAT) IV  100 mg Intravenous Q12H  . levETIRAcetam  1,500 mg  Intravenous Q12H  . pantoprazole sodium  40 mg Per Tube Daily  . sodium chloride flush  10-40 mL Intracatheter Q12H  . valproate sodium  500 mg Intravenous 3 times per day   Continuous Infusions: . sodium chloride 10 mL/hr at 10/14/15 0700  . sodium chloride Stopped (10/14/15 1200)  . dextrose    . fentaNYL infusion INTRAVENOUS 400 mcg/hr (10/15/15 0800)  . heparin 1,100 Units/hr (10/15/15 0800)  . insulin (NOVOLIN-R) infusion Stopped (10/11/15 1900)  . midazolam (VERSED) infusion 100 mg/hr (10/15/15 0715)  . norepinephrine (LEVOPHED) Adult infusion 29 mcg/min (10/15/15 0842)  . propofol (DIPRIVAN) infusion 70 mcg/kg/min (10/15/15 0800)   PRN Meds:.acetaminophen, dextrose, fentaNYL, midazolam, ondansetron (ZOFRAN) IV, sodium chloride flush  Assessment/Plan:  Active Problems:   Cardiac arrest with ventricular fibrillation (HCC)   ST elevation myocardial infarction (STEMI) [facility])   Cardiac arrest ([place])   Encounter for central line placement   Hypokalemia   High anion gap metabolic acidosis   Encephalopathy acute   Acute on chronic respiratory failure ([place])   Status epilepticus ([place])   Acute respiratory failure ([place])  Out of hospital ventricular fibrillatory arrest/cardiac arrest on 10/04/2015 -Continuing medical support, appreciate critical care team oversight at this point. We are following along side.   Severe coronary artery disease -Once neurologic status stable, cardiothoracic surgery consult  Acute systolic heart failure/cardiomyopathy -Intra-aortic balloon pump in place to help provide decreased afterload and increased coronary perfusion. -Per previous attendings, once neuro status clarified and patient were amenable can decide on pulling intra-aortic balloon pump. -EF 25% -Trying to diuresis best as possible as blood pressure tolerates.  Seizure -Multiple medications being utilized see above. Neuro following.    SKAINS, Kennedy 10/15/2015, 10:05 AM

## 2015-10-15 NOTE — Progress Notes (Signed)
CSW engaged with Patient's wife in waiting room to discuss her options regarding accessing her Husband's bank account to manage it and pay bills. CSW explained that with Patient being intubated and chemically sedated, he cannot consent to Financial POA. CSW explained that Financial POA has to be initiated by the Patient and Patient must be competent and of sound mind to consent to it. CSW encouraged Patient's wife to contact an attorney to assist with accessing fiances. Patient's wife reports that she has already called around and was informed that she would have to obtain guardianship vs. Financial POA. Patient's wife is requesting a letter from Patient's attending MD stating that Patient is incompetent due to being on the ventilator and under sedation. CSW agreed to staff with Patient's RN and attending physician re: letter of incompetence.  CSW will continue to follow for disposition.   Noe GensAshley Gardner, MSW, LCSW [facility] Clinical Social Worker [phone]

## 2015-10-15 NOTE — Progress Notes (Signed)
LTM checked, no skin breakdown seen.

## 2015-10-15 NOTE — Progress Notes (Signed)
EEG electrodes checked and several were re-preped. All Imp below 10K. No skin breakdown noted.

## 2015-10-15 NOTE — Progress Notes (Signed)
Critical ABG results called to RN at e-link and given to Dr. Dellie CatholicSommers.

## 2015-10-15 NOTE — Progress Notes (Signed)
eLink Physician-Brief Progress Note Patient Name: Clarence Brooks DOB: 09/09/1950 MRN: [id number]   Date of Service  10/15/2015  HPI/Events of Note  ABG on 100%/PRVC 24/TV 620/P 10 = 7.23/38/114/16.8. No evidence of air trapping now on rate = 24.   eICU Interventions  Continue present ventilator management.      Intervention Category Major Interventions: Acid-Base disturbance - evaluation and management;Respiratory failure - evaluation and management  Sommer,Steven Eugene 10/15/2015, 9:15 PM

## 2015-10-15 NOTE — Progress Notes (Signed)
ANTICOAGULATION CONSULT NOTE - Follow-up Consult  Pharmacy Consult for Heparin  Indication: IABP  Allergies  Allergen Reactions  . Lisinopril Swelling and Other (See Comments)    Throat swelling   . Ace Inhibitors Swelling    Patient Measurements: Height: 6' (182.9 cm) Weight: 221 lb 5.5 oz (100.4 kg) IBW/kg (Calculated) : 77.6   Vital Signs: Temp: 97 F (36.1 C) (03/27 0900) Temp Source: Core (Comment) (03/27 0800) BP: 97/46 mmHg (03/27 0900) Pulse Rate: 91 (03/27 0900)  Labs:  Recent Labs  10/13/15 0356 10/14/15 0430 10/14/15 1250 10/14/15 2018 10/15/15 0415  HGB 12.0* 11.6*  --   --  11.5*  HCT 36.5* 37.1*  --   --  37.2*  PLT 168 158  --   --  143*  HEPARINUNFRC  --  0.17* 0.27* 0.22* 0.40  CREATININE 1.19 1.28*  --   --  1.77*    Estimated Creatinine Clearance: 52.4 mL/min (by C-G formula based on Cr of 1.77).   Assessment: [age] yom admitted 10/05/2015 with STEMI. S/p cardiac arrest. Pt received CPR and 2 episodes w/ ROSC. S/p PCI and IABP inserted 3/22. Pt continues on heparin for ACS/IABP.  -heparin level= 0.4 and at goal -CBC remained stable, no bleeding issues noted  Goal of Therapy:  HL 0.2-0.5 Monitor platelets by anticoagulation protocol: Yes   Plan:   Continue heparin 1100 units/hr Daily CBC, HL  Sheppard CoilFrank Eulogio PharmD., BCPS Clinical Pharmacist Pager [phone] 10/15/2015 10:05 AM

## 2015-10-16 ENCOUNTER — Inpatient Hospital Stay (HOSPITAL_COMMUNITY): Payer: Medicare Other

## 2015-10-16 DIAGNOSIS — I2119 ST elevation (STEMI) myocardial infarction involving other coronary artery of inferior wall: Secondary | ICD-10-CM

## 2015-10-16 LAB — CBC
HEMATOCRIT: 32.5 % — AB (ref 39.0–52.0)
HEMOGLOBIN: 10.5 g/dL — AB (ref 13.0–17.0)
MCH: 30.9 pg (ref 26.0–34.0)
MCHC: 32.3 g/dL (ref 30.0–36.0)
MCV: 95.6 fL (ref 78.0–100.0)
Platelets: 124 10*3/uL — ABNORMAL LOW (ref 150–400)
RBC: 3.4 MIL/uL — AB (ref 4.22–5.81)
RDW: 15.2 % (ref 11.5–15.5)
WBC: 13.4 10*3/uL — AB (ref 4.0–10.5)

## 2015-10-16 LAB — POCT I-STAT 3, ART BLOOD GAS (G3+)
Acid-base deficit: 9 mmol/L — ABNORMAL HIGH (ref 0.0–2.0)
BICARBONATE: 16.8 meq/L — AB (ref 20.0–24.0)
O2 Saturation: 99 %
PCO2 ART: 36.6 mmHg (ref 35.0–45.0)
PO2 ART: 131 mmHg — AB (ref 80.0–100.0)
TCO2: 18 mmol/L (ref 0–100)
pH, Arterial: 7.272 — ABNORMAL LOW (ref 7.350–7.450)

## 2015-10-16 LAB — GLUCOSE, CAPILLARY
GLUCOSE-CAPILLARY: 209 mg/dL — AB (ref 65–99)
GLUCOSE-CAPILLARY: 252 mg/dL — AB (ref 65–99)
GLUCOSE-CAPILLARY: 260 mg/dL — AB (ref 65–99)
Glucose-Capillary: 169 mg/dL — ABNORMAL HIGH (ref 65–99)
Glucose-Capillary: 221 mg/dL — ABNORMAL HIGH (ref 65–99)
Glucose-Capillary: 223 mg/dL — ABNORMAL HIGH (ref 65–99)
Glucose-Capillary: 244 mg/dL — ABNORMAL HIGH (ref 65–99)

## 2015-10-16 LAB — HEPARIN LEVEL (UNFRACTIONATED): Heparin Unfractionated: 0.32 IU/mL (ref 0.30–0.70)

## 2015-10-16 MED ORDER — MIDAZOLAM HCL 2 MG/2ML IJ SOLN: 2.0000 mg | INTRAMUSCULAR | Status: DC | PRN

## 2015-10-16 MED ORDER — FENTANYL CITRATE (PF) 100 MCG/2ML IJ SOLN: 50.0000 ug | INTRAMUSCULAR | Status: DC | PRN

## 2015-10-16 NOTE — Progress Notes (Signed)
Cardiologist: Dr. Percival Spanish Subjective:   Out of hospital VF arrest anterolateral STEMI, triple-vessel disease, EF 25%.  Seizures ongoing, family meeting  Objective:  Vital Signs in the last 24 hours: Temp:  [99 F (37.2 C)-101.7 F (38.7 C)] 99 F (37.2 C) (03/28 0845) Pulse Rate:  [80-164] 82 (03/28 1102) Resp:  [0-24] 24 (03/28 1102) BP: (105-152)/(60-101) 111/60 mmHg (03/28 1102) SpO2:  [86 %-100 %] 100 % (03/28 1102) Arterial Line BP: (92-142)/(51-77) 110/62 mmHg (03/28 0845) FiO2 (%):  [40 %-100 %] 100 % (03/28 1102) Weight:  [233 lb 11 oz (106 kg)] 233 lb 11 oz (106 kg) (03/28 0543)  Intake/Output from previous day: 03/27 0701 - 03/28 0700 In: 6206.4 [I.V.:3806.4; NG/GT:1540; IV Piggyback:860] Out: 937 [Urine:937]   Physical Exam: General: Sedate, on vent, ill-appearing. Head:  Normocephalic and atraumatic. Ventilator Lungs: Clear to auscultation and percussion. Heart: Normal S1 and S2.  No murmur, rubs or gallops.  Abdomen: soft, non-tender, positive bowel sounds. Extremities: No clubbing or cyanosis. No edema. Balloon pump GU: Foley catheter in place Neurologic: Sedate   Lab Results:  Recent Labs  10/14/15 0430 10/15/15 0415  WBC 10.4 8.9  HGB 11.6* 11.5*  PLT 158 143*    Recent Labs  10/14/15 0430 10/15/15 0415  NA 151* 151*  K 3.8 4.3  CL 123* 122*  CO2 19* 17*  GLUCOSE 126* 137*  BUN 7 15  CREATININE 1.28* 1.77*    Recent Labs  10/15/15 0415  PROT 5.6*  ALBUMIN 1.9*  AST 47*  ALT 67*  ALKPHOS 108  BILITOT 0.4    Imaging: Dg Chest Port 1 View  10/15/2015  CLINICAL DATA:  Acute on chronic respiratory failure, cardiac arrest, STEMI EXAM: PORTABLE CHEST 1 VIEW COMPARISON:  Portable chest x-ray of October 14, 2015 FINDINGS: The lungs are well-expanded. There are layering pleural effusions posteriorly greatest on the right. The retrocardiac region is dense. The cardiac silhouette is mildly enlarged. The pulmonary vascularity is  engorged and indistinct. The endotracheal tube tip lies approximately 4.6 cm above the carina. The esophagogastric tube tip projects below the inferior margin of the image. The right internal jugular venous catheter tip projects over the proximal portion of the SVC. An external pacemaker defibrillator pad is present. IMPRESSION: Worsening of CHF with bilateral pleural effusions, interstitial edema, and left lower lobe atelectasis. The support tubes are in reasonable position. Electronically Signed   By: David  Martinique M.D.   On: 10/15/2015 07:17   Personally viewed.   Telemetry: No adverse arrhythmias Personally viewed.    Meds: Scheduled Meds: . antiseptic oral rinse  7 mL Mouth Rinse 10 times per day  . aspirin  81 mg Oral Daily  . atorvastatin  80 mg Oral q1800  . cefTRIAXone (ROCEPHIN)  IV  2 g Intravenous Q24H  . chlorhexidine gluconate (SAGE KIT)  15 mL Mouth Rinse BID  . feeding supplement (PRO-STAT SUGAR FREE 64)  30 mL Per Tube Daily  . free water  200 mL Per Tube 3 times per day  . insulin aspart  2-6 Units Subcutaneous 6 times per day  . insulin glargine  10 Units Subcutaneous Q24H  . lacosamide (VIMPAT) IV  200 mg Intravenous Q12H  . levETIRAcetam  2,000 mg Intravenous Q12H  . pantoprazole sodium  40 mg Per Tube Daily  . phenytoin (DILANTIN) IV  100 mg Intravenous 3 times per day  . sodium chloride flush  10-40 mL Intracatheter Q12H  . valproate sodium  500 mg  Intravenous 4 times per day   Continuous Infusions: . sodium chloride 10 mL/hr at 10/16/15 0800  . sodium chloride Stopped (10/14/15 1200)  . dextrose    . feeding supplement (VITAL AF 1.2 CAL) 1,000 mL (10/16/15 0700)  . heparin 1,100 Units/hr (10/16/15 0800)  . norepinephrine (LEVOPHED) Adult infusion 32 mcg/min (10/16/15 0919)  . phenylephrine (NEO-SYNEPHRINE) Adult infusion 100 mcg/min (10/16/15 1114)  . vasopressin (PITRESSIN) infusion - *FOR SHOCK* 0.03 Units/min (10/16/15 0800)   PRN Meds:.acetaminophen,  dextrose, fentaNYL (SUBLIMAZE) injection, fentaNYL (SUBLIMAZE) injection, midazolam, midazolam, ondansetron (ZOFRAN) IV, sodium chloride flush  Assessment/Plan:  Active Problems:   Cardiac arrest with ventricular fibrillation (HCC)   ST elevation myocardial infarction (STEMI) ([place])   Cardiac arrest ([place])   Encounter for central line placement   Hypokalemia   High anion gap metabolic acidosis   Encephalopathy acute   Acute on chronic respiratory failure ([place])   Status epilepticus ([place])   Acute respiratory failure ([place])   Severe anoxic-ischemic encephalopathy ([place])  Out of hospital ventricular fibrillatory arrest/cardiac arrest on 10/09/2015 -Had family meeting with critical care team and nursing. Trade the gravity of the situation, poor overall prognosis, worrisome neurologic findings on EEG consistent with severe anoxic brain injury as well as worsening renal function, need for pressor support. Wife understands and does not want patient to suffer more than necessary, DO NOT RESUSCITATE order was made. Reassessing condition continuously over the next 24 hours.  -Continuing full medical support, appreciate critical care team oversight at this point.    Severe coronary artery disease -Once neurologic status stable, cardiothoracic surgery consult  Acute systolic heart failure/cardiomyopathy -Intra-aortic balloon pump in place to help provide decreased afterload and increased coronary perfusion. Continuing heparin IV. Placement evaluated on x-ray. -Continuing with full support at this time. -EF 25% with underlying severe coronary artery disease. -Difficult to diuresis as blood pressure decreases. -He is showing signs of renal failure  Seizure -Multiple medications being utilized see above. Neuro following. -Anoxic brain injury. Explained to family.  Very concerned about long-term prognosis. He was a very vibrant man according to his wife and family, active in his community, on the go and  understandably so his current status is very difficult to swallow.  Critical care time 40 minutes spent with patient, digestion of complex data in the ICU setting in the setting of multisystem organ failure, family care team communication discussion/meeting.    SKAINS, [place] 10/16/2015, 11:30 AM

## 2015-10-16 NOTE — Progress Notes (Signed)
eLink Physician-Brief Progress Note Patient Name: Clarence Brooks DOB: [birth year] MRN: [id number]   Date of Service  10/16/2015  HPI/Events of Note  Called by RN re: elevated glucose levels. Per sign out, care is not to be escalated, and family considering comfort measures.   eICU Interventions  No change at this time. Continue standard scale, can consider change to higher scale if remains elevated.         Shane Crutchradeep Corky Blumstein 10/16/2015, 8:40 PM

## 2015-10-16 NOTE — Progress Notes (Signed)
PULMONARY / CRITICAL CARE MEDICINE   Name: Clarence Brooks MRN: [id number] DOB: [birth year]    ADMISSION DATE:  09/26/2015 CONSULTATION DATE:  09/20/2015  REFERRING MD:  Claiborne Billings  CHIEF COMPLAINT:  Cardiac Arrest  BRIEF Hx:   Clarence Brooks is a 65 y.o. male with PMH as outlined below including HTN, HLD, DM2, obesity.  He was [facility] to [facility] ED 03/22 via EMS after a witnessed out of hospital VF arrest with EKG revealing anterolateral STEMI.  CPR was apparently immediately started on the scene and on EMS arrival, he was given 2 rounds of epi as well as 1 defibrillation prior to ROSC.  He had king airway placed in the field and this was switched to ETT in ED.  On arrival to ED, he was met by cardiology team who took him for emergent cardiac cath. Per RN report, pt had severe 3 vessel disease that was not amenable to PCI.  He had IABP placed for EF of roughly 25% and will need CVTS input.  Following cath, he returned to the ICU and hypothermia protocol was initiated.  Pt with szes/status epilepticus over the weekend.   SUBJECTIVE:   szes controlled. Off sedatives -- pt is still comfortable.  Had desatn this am >> had to be bagged.  On pressors for low BP overnight.    VITAL SIGNS: BP 115/65 mmHg  Pulse 81  Temp(Src) 99 F (37.2 C) (Core (Comment))  Resp 24  Ht 6' (1.829 m)  Wt 233 lb 11 oz (106 kg)  BMI 31.69 kg/m2  SpO2 91%  HEMODYNAMICS: CVP:  [12 mmHg-18 mmHg] 16 mmHg  VENTILATOR SETTINGS: Vent Mode:  [-] PRVC FiO2 (%):  [40 %-100 %] 70 % Set Rate:  [18 bmp-24 bmp] 24 bmp Vt Set:  [620 mL] 620 mL PEEP:  [5 cmH20-10 cmH20] 10 cmH20 Plateau Pressure:  [21 cmH20-29 cmH20] 29 cmH20  INTAKE / OUTPUT: I/O last 3 completed shifts: In: 9380.7 [I.V.:6055.7; NG/GT:2260; IV UUEKCMKLK:9179] Out: 1505 [WPVXY:8016]  PHYSICAL EXAMINATION: General: Adult male, resting in bed, critically ill. (-) obvious szes noted.  Neuro: deep sedation, no response to stimuli. No gag. Pupils 2 mm non  reactive. (-) corneals. (+) Dolls eye. (-) withdrawal to deep sternal rub. Pt was disconnected from the ventilator -- he was breathing 12-13 x/minutes HEENT: /AT. PERRL, sclerae anicteric. Cardiovascular: RRR, no M/R/G.  Lungs: Respirations even and unlabored. Crackles mid-bases. Abdomen: BS x 4, soft, NT/ND.  Musculoskeletal: No gross deformities, Gr 2  edema.  Skin: Intact, warm, no rashes.  LABS:  BMET  Recent Labs Lab 10/13/15 0356 10/14/15 0430 10/15/15 0415  NA 144 151* 151*  K 3.6 3.8 4.3  CL 117* 123* 122*  CO2 19* 19* 17*  BUN _0 CREATININE 1.19 1.28* 1.77*  GLUCOSE 128* 126* 137*    Electrolytes  Recent Labs Lab 10/11/15 0400 10/11/15 1105  10/12/15 0450  10/13/15 0356 10/14/15 0430 10/15/15 0415  CALCIUM 8.6*  8.6* 8.4*  < > 8.2*  < > 8.2* 8.2* 7.9*  MG 2.1 1.9  --  1.8  --  2.0  --   --   PHOS 1.6*  --   --  3.5  --  3.8  --   --   < > = values in this interval not displayed.  CBC  Recent Labs Lab 10/13/15 0356 10/14/15 0430 10/15/15 0415  WBC 7.9 10.4 8.9  HGB 12.0* 11.6* 11.5*  HCT 36.5* 37.1* 37.2*  PLT 168 158 143*  Coag's  Recent Labs Lab 09/23/2015 2200 10/11/15 0400  APTT 58* 62*  INR 1.20 1.18    Sepsis Markers  Recent Labs Lab 10/07/2015 1936 10/17/2015 2243 10/11/15 0500  LATICACIDVEN 7.67* 2.2* 2.5*    ABG  Recent Labs Lab 10/15/15 1752 10/15/15 2043 10/16/15 0756  PHART 7.178* 7.231* 7.272*  PCO2ART 43.3 38.3 36.6  PO2ART 67.0* 114.0* 131.0*    Liver Enzymes  Recent Labs Lab 10/13/15 1054 10/14/15 0430 10/15/15 0415  AST 48* 39 47*  ALT 106* 85* 67*  ALKPHOS 77 93 108  BILITOT 0.5 0.4 0.4  ALBUMIN 2.2* 2.1* 1.9*    Cardiac Enzymes  Recent Labs Lab 10/11/15 0400 10/11/15 1105 10/11/15 1600  TROPONINI 0.29* 0.34* 0.38*    Glucose  Recent Labs Lab 10/15/15 0825 10/15/15 1105 10/15/15 1625 10/15/15 2039 10/16/15 0016 10/16/15 0507  GLUCAP 137* 117* 112* 161* 169* 221*     Imaging No results found.   STUDIES:  CXR 03/22 > no acute process. CXR 3/25 > images personally reviewed, R effusion ECHO 3/23 >> LVEF 20-25%, severe global hypokinesis, mod LVH, diastolic dysfunction, elevated filling pressure EEG 3/24 >> burst suppression pattern with electrographic seizures within bursts, pattern usually associated with poor outcome  CXR 3/27 >> worse CHF, more B effusion  CULTURES: BCx2 3/23 >> (-) Sputum 3/24 >> abundant H influenza UC 3/23 >> (-)  ANTIBIOTICS: Rocephin 3/27 >>  SIGNIFICANT EVENTS: 3/22  Admitted after out of hospital VF arrest.   3/25  Reached re-warming ~ 0200 3/26  Ongoing burst activity on EEG despite heavy (versed _0 , prop 70 mcg, fent 400 + AED's) sedation  LINES/TUBES: ETT 03/22 > R IJ CVL 3/22 > R rad A line 3/22 >   DISCUSSION: 65 y.o. M admitted 03/22 after out of hospital VF arrest.  He was taken to cath lab for emergent cardiac cath and  this revealed severe 3 vessel disease not amenable to PCI.  He had EF of 25% so IABP was placed. Following procedure, he returned to ICU where hypothermia protocol was initiated.  Re-warmed ~0200 3/25.  Szes over the weekend.   ASSESSMENT / PLAN:  CARDIOVASCULAR A:  Out of hospital VF arrest - s/p cardiac cath with reported severe 3 vessel disease not amenable to PCI.  EF of 25% so IABP was placed sCHF - reported EF of ~25%, global hypokinesis, also with Diastolic Dysfxn  Severe 3 vessel CAD  Hx HTN, HLD P:  S/P cooling protocol >> rewarmed.  Pt needs diuresis once BP tolerates. 10L (+) with worse CHF on CXR. Cant diuresce as pt is on 3 pressors. Cardiology following, appreciate input Will need CVTS consult for CABG evaluation pending neuro prognostication Pt on IABP  1: 1.   NEUROLOGIC A:   Acute metabolic encephalopathy - secondary to cardiac arrest. Severe Anoxi Injury Status Epilepticus -- controlled. Hx anxiety P:   Pt with EEG c/w severe anoxic injury.  Neurology  following, appreciate input Observe for sze recurrence  PULMONARY A: Acute hypoxemic hypercarbic respiratory failure - hypercarbia resolved, due to respiratory insufficiency in the setting of cardiac arrest, pulm edema, possible CAP with H influenza in sputum.  Mucus Plugging - resolved.  P:   PRVC, 8 cc/kg VAP prevention measures On rocephin for H influenza. No weaning.   RENAL A:   AKI -- worsening Volume overload Hypokalemia - anticipate worsening during hypothermia protocol Hypocalcemia Hyperchloremia - suspect 2/2 to NS administration AGMA - lactate  P:   Replace electrolytes  as indicated Reduce NS to KVO Trend BMP / UOP  Creat climbing, making urine. Needs diuresis once BP tolerates.   GASTROINTESTINAL A:   GI prophylaxis Nutrition P:  SUP: Pantoprazole Cont TF  HEMATOLOGIC A:   Anemia - mild, no evidence of bleeding  VTE Prophylaxis P:  SCD's / heparin gtt per pharmacy  Trend CBC  INFECTIOUS A:   CAP with abundant H. Influenza in sputum  P:  Cont Rocephin Monitor clinically Cultures as above   ENDOCRINE A:   DM  2 TSH wnl  P:   ICU hyperglycemia protocol Lantus 10 units Q24 Observe BS -- may need tighter sugar control.    We had extensive family meeting today with wife and 2 children as arranged by Neuro on 3/27. Myself,  Dr. Marlou Porch (Cardiology),  were present as well as Fraser Din, nurse taking care of pt today.  D/w family re: poor over all poor prognosis. Family understands.  Wife wants pt "not to suffer" more than necessary. Wife wants to make pt DNR, no escalation of care, no HD/CVVH. Will reasses condition in next 24 hrs.      I spent 35  minutes of Critical care time with this patient today.   Monica Becton, MD 10/16/2015, 8:21 AM [facility] Pager [phone] After 3 pm or if no answer, call [phone]

## 2015-10-16 NOTE — Progress Notes (Signed)
Pt spo2 decreased to 72% on fio2 100% with a peep of 10. Pt taken off ventilator and manually ventilated with peep valve at 15 x2 minutes. SpO2 improved to 97%. Pt placed back on ventilator and ABG obtained. RN at bedside. RT will continue to monitor

## 2015-10-16 NOTE — Progress Notes (Signed)
eLink Physician-Brief Progress Note Patient Name: Fredirick MaudlinWilson Shelp DOB: 01-25-1951 MRN: [id number]   Date of Service  10/16/2015  HPI/Events of Note  RN noted abdomen becoming progressively more distended.   eICU Interventions  Tube feeds stopped. Check abd xray.      Intervention Category Intermediate Interventions: Abdominal pain - evaluation and management  Shane Crutchradeep Barbara Keng 10/16/2015, 9:59 PM

## 2015-10-16 NOTE — Progress Notes (Signed)
ANTICOAGULATION CONSULT NOTE - Follow-up Consult  Pharmacy Consult for Heparin  Indication: IABP  Allergies  Allergen Reactions  . Lisinopril Swelling and Other (See Comments)    Throat swelling   . Ace Inhibitors Swelling    Patient Measurements: Height: 6' (182.9 cm) Weight: 233 lb 11 oz (106 kg) IBW/kg (Calculated) : 77.6   Vital Signs: Temp: 99 F (37.2 C) (03/28 0845) Temp Source: Core (Comment) (03/28 0800) BP: 115/65 mmHg (03/28 0308) Pulse Rate: 164 (03/28 0845)  Labs:  Recent Labs  10/14/15 0430  10/14/15 2018 10/15/15 0415 10/16/15 0523  HGB 11.6*  --   --  11.5*  --   HCT 37.1*  --   --  37.2*  --   PLT 158  --   --  143*  --   HEPARINUNFRC 0.17*  < > 0.22* 0.40 0.32  CREATININE 1.28*  --   --  1.77*  --   < > = values in this interval not displayed.  Estimated Creatinine Clearance: 53.8 mL/min (by C-G formula based on Cr of 1.77).   Assessment: [age] yom admitted 10/02/2015 with STEMI. S/p cardiac arrest. Pt received CPR and 2 episodes w/ ROSC. S/p PCI and IABP inserted 3/22. Pt continues on heparin for ACS/IABP.  -heparin level= 0.32 and at goal -CBC not done this am will order  Goal of Therapy:  HL 0.2-0.5 Monitor platelets by anticoagulation protocol: Yes   Plan:   Continue heparin 1100 units/hr Daily CBC, HL  Sheppard CoilFrank Kemuel PharmD., BCPS Clinical Pharmacist Pager [phone] 10/16/2015 9:30 AM

## 2015-10-16 NOTE — Progress Notes (Signed)
     Family meeting today at 10:30 AM. Discussed with critical care team.  Donato SchultzSKAINS, Irmgard Rampersaud, MD

## 2015-10-16 NOTE — Progress Notes (Signed)
Interval History:                                                                                                                      Clarence Brooks is an 65 y.o. male patient with  severe anoxic encephalopathy, post V. fib cardiac arrest. EEG  continued to show show severe background suppression with no discernible cerebral activity noted, with intermittent burst activity of abnormal polyspike discharges. He is on high doses of several antiepileptic medications as noted below. He is off sedating medications .  Had a family meeting with his wife and son.    Past Medical History: Past Medical History  Diagnosis Date  . Diabetes mellitus without complication (HCC)   . Hypertension   . Dyslipidemia   . Dizziness   . Anxiety   . Osteoarthritis   . Special screening for malignant neoplasm of prostate   . Encounter for long-term (current) use of other medications     Past Surgical History  Procedure Laterality Date  . Knee surgery      bilateral  . Umbilical hernia repair    . Carpal tunnel release      bilateral  . Ankle surgery    . Cardiac catheterization N/A 10-26-15    Procedure: Left Heart Cath and Coronary Angiography;  Surgeon: Lennette Bihari, MD;  Location: [facility] INVASIVE CV LAB;  Service: Cardiovascular;  Laterality: N/A;  . Cardiac catheterization N/A 10/26/15    Procedure: IABP Insertion;  Surgeon: Lennette Bihari, MD;  Location: [facility] INVASIVE CV LAB;  Service: Cardiovascular;  Laterality: N/A;    Family History: Family History  Problem Relation Age of Onset  . Hypertension Mother   . Cancer Father     Throat  . Hypertension Brother     Social History:   reports that he has never smoked. He does not have any smokeless tobacco history on file. His alcohol and drug histories are not on file.  Allergies:  Allergies  Allergen Reactions  . Lisinopril Swelling and Other (See Comments)    Throat swelling   . Ace Inhibitors Swelling     Medications:                                                                                                                          Current facility-administered medications:  .  0.9 %  sodium chloride infusion, , Intravenous, Continuous, Jeanella Craze, NP, Stopped at 10/16/15 1846 .  0.9 %  sodium chloride infusion, , Intravenous, Continuous, Jeanella Craze, NP, Stopped at 10/14/15 1200 .  acetaminophen (TYLENOL) tablet 650 mg, 650 mg, Oral, Q4H PRN, Lennette Bihari, MD, 650 mg at 10/15/15 1900 .  antiseptic oral rinse solution (CORINZ), 7 mL, Mouth Rinse, 10 times per day, Roslynn Amble, MD, 7 mL at 10/16/15 1800 .  aspirin chewable tablet 81 mg, 81 mg, Oral, Daily, Lennette Bihari, MD, 81 mg at 10/16/15 1029 .  atorvastatin (LIPITOR) tablet 80 mg, 80 mg, Oral, q1800, Lennette Bihari, MD, 80 mg at 10/16/15 1846 .  cefTRIAXone (ROCEPHIN) 2 g in dextrose 5 % 50 mL IVPB, 2 g, Intravenous, Q24H, Jose Alexis Frock, MD, 2 g at 10/16/15 1123 .  chlorhexidine gluconate (PERIDEX) 0.12 % solution 15 mL, 15 mL, Mouth Rinse, BID, Roslynn Amble, MD, 15 mL at 10/16/15 0800 .  dextrose 10 % infusion, , Intravenous, Continuous PRN, Roslynn Amble, MD .  feeding supplement (PRO-STAT SUGAR FREE 64) liquid 30 mL, 30 mL, Per Tube, Daily, Jose Angelo A Christene Slates, MD, 30 mL at 10/16/15 1028 .  feeding supplement (VITAL AF 1.2 CAL) liquid 1,000 mL, 1,000 mL, Per Tube, Continuous, Jose Angelo A Christene Slates, MD, Last Rate: 70 mL/hr at 10/16/15 0700, 1,000 mL at 10/16/15 0700 .  fentaNYL (SUBLIMAZE) injection 50 mcg, 50 mcg, Intravenous, Q15 min PRN, Jose Angelo A de [place]ois, MD .  fentaNYL (SUBLIMAZE) injection 50 mcg, 50 mcg, Intravenous, Q2H PRN, Jose Angelo A de [place]vast[place], MD .  free water 200 mL, 200 mL, Per Tube, 3 times per day, Lupita Leash, MD, 200 mL at 10/16/15 1402 .  heparin ADULT infusion 100 units/mL (25000 units/250 mL), 1,100 Units/hr, Intravenous, Continuous, Veronda P Bryk, RPH, Last Rate: 11 mL/hr at 10/16/15 0800, 1,100 Units/hr  at 10/16/15 0800 .  insulin aspart (novoLOG) injection 2-6 Units, 2-6 Units, Subcutaneous, 6 times per day, Roslynn Amble, MD, 6 Units at 10/16/15 1554 .  insulin glargine (LANTUS) injection 10 Units, 10 Units, Subcutaneous, Q24H, Roslynn Amble, MD, 10 Units at 10/15/15 2238 .  lacosamide (VIMPAT) 200 mg in sodium chloride 0.9 % 25 mL IVPB, 200 mg, Intravenous, Q12H, Ora Mcnatt Daniel Nones, MD, 200 mg at 10/16/15 1016 .  levETIRAcetam (KEPPRA) 2,000 mg in sodium chloride 0.9 % 100 mL IVPB, 2,000 mg, Intravenous, Q12H, Debar Plate Daniel Nones, MD, 2,000 mg at 10/16/15 1947 .  midazolam (VERSED) injection 2 mg, 2 mg, Intravenous, Q15 min PRN, Jose Angelo A Christene Slates, MD .  midazolam (VERSED) injection 2 mg, 2 mg, Intravenous, Q2H PRN, Jose Angelo A Christene Slates, MD .  norepinephrine (LEVOPHED) 16 mg in dextrose 5 % 250 mL (0.064 mg/mL) infusion, 0-50 mcg/min, Intravenous, Titrated, Alyson Reedy, MD, Last Rate: 16.9 mL/hr at 10/16/15 1945, 18 mcg/min at 10/16/15 1945 .  ondansetron (ZOFRAN) injection 4 mg, 4 mg, Intravenous, Q6H PRN, Lennette Bihari, MD .  pantoprazole sodium (PROTONIX) 40 mg/20 mL oral suspension 40 mg, 40 mg, Per Tube, Daily, Sherron Monday, RPH, 40 mg at 10/16/15 1028 .  phenylephrine (NEO-SYNEPHRINE) 40 mg in dextrose 5 % 250 mL (0.16 mg/mL) infusion, 30-200 mcg/min, Intravenous, Continuous, Jose Angelo A de Lucy Chris, MD, Last Rate: 22.5 mL/hr at 10/16/15 1848, 60 mcg/min at 10/16/15 1848 .  phenytoin (DILANTIN) injection 100 mg, 100 mg, Intravenous, 3 times per day, Bahja Bence Daniel Nones, MD, 100 mg at 10/16/15 1412 .  sodium chloride flush (NS) 0.9 %  injection 10-40 mL, 10-40 mL, Intracatheter, Q12H, Roslynn AmbleJennings E Nestor, MD, 10 mL at 10/15/15 2245 .  sodium chloride flush (NS) 0.9 % injection 10-40 mL, 10-40 mL, Intracatheter, PRN, Roslynn AmbleJennings E Nestor, MD .  valproate (DEPACON) 500 mg in dextrose 5 % 50 mL IVPB, 500 mg, Intravenous, 4 times per day, Breonia Kirstein Daniel NonesNarayan Kaveer  Asher Torpey, MD, 500 mg at 10/16/15 1846 .  vasopressin (PITRESSIN) 40 Units in sodium chloride 0.9 % 250 mL (0.16 Units/mL) infusion, 0.03 Units/min, Intravenous, Continuous, Jose Alexis FrockAngelo A de Dios, MD, Last Rate: 11.3 mL/hr at 10/16/15 1846, 0.03 Units/min at 10/16/15 1846   Neurologic Examination:                                                                                                     Today's Vitals   10/16/15 1900 10/16/15 1915 10/16/15 1919 10/16/15 2000  BP:   113/53   Pulse: 89 90 88 88  Temp: 99.1 F (37.3 C) 99.1 F (37.3 C)  99.1 F (37.3 C)  TempSrc:      Resp: 24 22 24 22   Height:      Weight:      SpO2: 94% 96% 96% 95%   He is intubated,  pupils 4 mm, nonreactive, Right corneal present, absent left corneal response, no motor response to stimulation   Lab Results: Basic Metabolic Panel:  Recent Labs Lab 10/11/15 0400  10/11/15 1105  10/12/15 0450 10/12/15 1030 10/13/15 0356 10/14/15 0430 10/15/15 0415  NA 141  138  < > 141  < > 141 140 144 151* 151*  K 3.1*  3.0*  < > 2.8*  < > 3.6 3.5 3.6 3.8 4.3  CL 110  110  < > 112*  < > 115* 114* 117* 123* 122*  CO2 19*  18*  --  19*  < > 18* 18* 19* 19* 17*  GLUCOSE 258*  262*  < > 179*  < > 157* 156* 128* 126* 137*  BUN 13  13  < > 12  < > 10 10 9 7 15   CREATININE 1.01  1.01  < > 0.87  < > 0.76 0.91 1.19 1.28* 1.77*  CALCIUM 8.6*  8.6*  --  8.4*  < > 8.2* 8.2* 8.2* 8.2* 7.9*  MG 2.1  --  1.9  --  1.8  --  2.0  --   --   PHOS 1.6*  --   --   --  3.5  --  3.8  --   --   < > = values in this interval not displayed.  Liver Function Tests:  Recent Labs Lab 2016/06/30 1928 10/13/15 1054 10/14/15 0430 10/15/15 0415  AST 150* 48* 39 47*  ALT 166* 106* 85* 67*  ALKPHOS 72 77 93 108  BILITOT 0.8 0.5 0.4 0.4  PROT 7.2 5.4* 5.6* 5.6*  ALBUMIN 3.4* 2.2* 2.1* 1.9*   No results for input(s): LIPASE, AMYLASE in the last 168 hours.  Recent Labs Lab 10/13/15 1145  AMMONIA 34    CBC:  Recent  Labs Lab 2016/06/30  1928  10/12/15 0450 10/13/15 0356 10/14/15 0430 10/15/15 0415 10/16/15 1115  WBC 8.9  < > 6.6 7.9 10.4 8.9 13.4*  NEUTROABS 3.9  --   --   --   --   --   --   HGB 13.1  < > 12.0* 12.0* 11.6* 11.5* 10.5*  HCT 41.1  < > 37.3* 36.5* 37.1* 37.2* 32.5*  MCV 94.3  < > 91.0 93.4 95.4 97.1 95.6  PLT 192  < > 167 168 158 143* 124*  < > = values in this interval not displayed.  Cardiac Enzymes:  Recent Labs Lab 10/08/2015 2200 10/11/15 0400 10/11/15 1105 10/11/15 1600  TROPONINI 0.07* 0.29* 0.34* 0.38*    Lipid Panel:  Recent Labs Lab 10/15/15 0415  TRIG 345*    CBG:  Recent Labs Lab 10/16/15 0016 10/16/15 0507 10/16/15 0816 10/16/15 1132 10/16/15 1546  GLUCAP 169* 221* 223* 209* 244*    Microbiology: Results for orders placed or performed during the hospital encounter of 10/15/2015  MRSA PCR Screening     Status: None   Collection Time: 10/17/2015 10:28 PM  Result Value Ref Range Status   MRSA by PCR NEGATIVE NEGATIVE Final    Comment:        The GeneXpert MRSA Assay (FDA approved for NASAL specimens only), is one component of a comprehensive MRSA colonization surveillance program. It is not intended to diagnose MRSA infection nor to guide or monitor treatment for MRSA infections.   Culture, Urine     Status: None   Collection Time: 10/11/15 11:02 PM  Result Value Ref Range Status   Specimen Description URINE, CATHETERIZED  Final   Special Requests Normal  Final   Culture NO GROWTH 1 DAY  Final   Report Status 10/14/2015 FINAL  Final  Culture, blood (routine x 2)     Status: None (Preliminary result)   Collection Time: 10/11/15 11:15 PM  Result Value Ref Range Status   Specimen Description BLOOD LEFT ANTECUBITAL  Final   Special Requests IN PEDIATRIC BOTTLE 1CC  Final   Culture NO GROWTH 4 DAYS  Final   Report Status PENDING  Incomplete  Culture, blood (routine x 2)     Status: None (Preliminary result)   Collection Time: 10/11/15  11:33 PM  Result Value Ref Range Status   Specimen Description BLOOD RIGHT HAND  Final   Special Requests IN PEDIATRIC BOTTLE 1CC  Final   Culture NO GROWTH 4 DAYS  Final   Report Status PENDING  Incomplete  Culture, respiratory (NON-Expectorated)     Status: None   Collection Time: 10/12/15  1:10 AM  Result Value Ref Range Status   Specimen Description TRACHEAL ASPIRATE  Final   Special Requests Normal  Final   Gram Stain   Final    ABUNDANT WBC PRESENT,BOTH PMN AND MONONUCLEAR RARE SQUAMOUS EPITHELIAL CELLS PRESENT ABUNDANT GRAM NEGATIVE COCCOBACILLI RARE GRAM POSITIVE COCCI IN PAIRS Performed at [facility]    Culture   Final    ABUNDANT HAEMOPHILUS INFLUENZAE Note: BETA LACTAMASE NEGATIVE FEW STREPTOCOCCUS GROUP C Note: Beta hemolytic streptococci are predictably susceptible to penicillin and other beta lactams. Susceptibility testing not routinely performed. Performed at [facility]    Report Status 10/14/2015 FINAL  Final    Imaging: Dg Chest Port 1 View  10/15/2015  CLINICAL DATA:  Acute on chronic respiratory failure, cardiac arrest, STEMI EXAM: PORTABLE CHEST 1 VIEW COMPARISON:  Portable chest x-ray of October 14, 2015 FINDINGS: The  lungs are well-expanded. There are layering pleural effusions posteriorly greatest on the right. The retrocardiac region is dense. The cardiac silhouette is mildly enlarged. The pulmonary vascularity is engorged and indistinct. The endotracheal tube tip lies approximately 4.6 cm above the carina. The esophagogastric tube tip projects below the inferior margin of the image. The right internal jugular venous catheter tip projects over the proximal portion of the SVC. An external pacemaker defibrillator pad is present. IMPRESSION: Worsening of CHF with bilateral pleural effusions, interstitial edema, and left lower lobe atelectasis. The support tubes are in reasonable position. Electronically Signed   By: David  [place] M.D.   On:  10/15/2015 07:17   Dg Chest Port 1 View  10/14/2015  CLINICAL DATA:  Acute respiratory failure. EXAM: PORTABLE CHEST 1 VIEW COMPARISON:  10/13/2015. FINDINGS: Stable support apparatus. Cardiomegaly. Increasing RIGHT pleural effusion. Bibasilar opacities stable to slightly increased. No pneumothorax. IMPRESSION: Worsening aeration.  Increasing RIGHT pleural effusion. Electronically Signed   By: Elsie Stain M.D.   On: 10/14/2015 08:47   Dg Chest Port 1 View  10/13/2015  CLINICAL DATA:  Respiratory distress. EXAM: PORTABLE CHEST 1 VIEW COMPARISON:  10/12/2015. FINDINGS: Support apparatus stable. ETT in good position 6 cm above carina. RIGHT upper lobe atelectasis is improved. BILATERAL pleural effusions, RIGHT greater than LEFT for cyst. Mild vascular congestion. IMPRESSION: Improved aeration, with apparent clearing of suspected mucous plugging obstructing the RIGHT upper lobe bronchus. Electronically Signed   By: Elsie Stain M.D.   On: 10/13/2015 09:49    Assessment and plan:   Clarence Brooks is an 65 y.o. male patient with with severe anoxic brain injury secondary to V. fib cardiac arrest. Complete background suppression on the EEG is noted with no discernible brain activity, with intermittent burst of polyspike abnormal discharges. Increased his seizure medications yesterday as described in my note, with no change in the EEG. Do not recommend any further changes to his seizure medications at this time.  Extremely poor prognosis for recovery.  Had a family meeting with his wife and son, explained his current neurological assessment and EEG findings and very poor prognosis.  He is DO NOT RESUSCITATE status at this time. Family is not ready yet to accept prognosis. Several of their questions have been answered to their satisfaction.   We will discontinue EEG monitoring, as there has been no change in the EEG over the past 24 hours.   No other new recommendations from neurology standpoint. We'll  sign off.  We'll be available to discuss with family if needed.    Discussed with ICU attending, Dr. Christene Slates.

## 2015-10-16 NOTE — Progress Notes (Signed)
LTM EEG discontinued per Dr Lavon PaganiniNandigam.

## 2015-10-17 DIAGNOSIS — I6782 Cerebral ischemia: Secondary | ICD-10-CM

## 2015-10-17 DIAGNOSIS — G931 Anoxic brain damage, not elsewhere classified: Secondary | ICD-10-CM

## 2015-10-17 LAB — COMPREHENSIVE METABOLIC PANEL
ALT: 53 U/L (ref 17–63)
AST: 82 U/L — AB (ref 15–41)
Albumin: 1.5 g/dL — ABNORMAL LOW (ref 3.5–5.0)
Alkaline Phosphatase: 136 U/L — ABNORMAL HIGH (ref 38–126)
Anion gap: 13 (ref 5–15)
BUN: 55 mg/dL — AB (ref 6–20)
CHLORIDE: 114 mmol/L — AB (ref 101–111)
CO2: 15 mmol/L — AB (ref 22–32)
CREATININE: 5.89 mg/dL — AB (ref 0.61–1.24)
Calcium: 6.7 mg/dL — ABNORMAL LOW (ref 8.9–10.3)
GFR calc non Af Amer: 9 mL/min — ABNORMAL LOW (ref >60)
GFR, EST AFRICAN AMERICAN: 11 mL/min — AB (ref >60)
Glucose, Bld: 155 mg/dL — ABNORMAL HIGH (ref 65–99)
Potassium: 5.1 mmol/L (ref 3.5–5.1)
SODIUM: 142 mmol/L (ref 135–145)
Total Bilirubin: 0.6 mg/dL (ref 0.3–1.2)
Total Protein: 5.4 g/dL — ABNORMAL LOW (ref 6.5–8.1)

## 2015-10-17 LAB — CULTURE, BLOOD (ROUTINE X 2)
Culture: NO GROWTH
Culture: NO GROWTH

## 2015-10-17 LAB — GLUCOSE, CAPILLARY
GLUCOSE-CAPILLARY: 98 mg/dL (ref 65–99)
Glucose-Capillary: 108 mg/dL — ABNORMAL HIGH (ref 65–99)
Glucose-Capillary: 120 mg/dL — ABNORMAL HIGH (ref 65–99)
Glucose-Capillary: 148 mg/dL — ABNORMAL HIGH (ref 65–99)
Glucose-Capillary: 206 mg/dL — ABNORMAL HIGH (ref 65–99)
Glucose-Capillary: 251 mg/dL — ABNORMAL HIGH (ref 65–99)

## 2015-10-17 LAB — CBC
HCT: 33.1 % — ABNORMAL LOW (ref 39.0–52.0)
HEMOGLOBIN: 10.6 g/dL — AB (ref 13.0–17.0)
MCH: 30.2 pg (ref 26.0–34.0)
MCHC: 32 g/dL (ref 30.0–36.0)
MCV: 94.3 fL (ref 78.0–100.0)
Platelets: 124 10*3/uL — ABNORMAL LOW (ref 150–400)
RBC: 3.51 MIL/uL — AB (ref 4.22–5.81)
RDW: 15.4 % (ref 11.5–15.5)
WBC: 10.9 10*3/uL — ABNORMAL HIGH (ref 4.0–10.5)

## 2015-10-17 LAB — HEPARIN LEVEL (UNFRACTIONATED): Heparin Unfractionated: 0.58 IU/mL (ref 0.30–0.70)

## 2015-10-17 MED ORDER — ACETAMINOPHEN 160 MG/5ML PO SOLN
650.0000 mg | ORAL | Status: DC | PRN
  Filled 2015-10-17: qty 20.3

## 2015-10-17 NOTE — Progress Notes (Addendum)
ANTICOAGULATION CONSULT NOTE - Follow-up Consult  Pharmacy Consult for Heparin  Indication: IABP  Allergies  Allergen Reactions  . Lisinopril Swelling and Other (See Comments)    Throat swelling   . Ace Inhibitors Swelling    Patient Measurements: Height: 6' (182.9 cm) Weight: 240 lb 8.4 oz (109.1 kg) IBW/kg (Calculated) : 77.6   Vital Signs: Temp: 99.7 F (37.6 C) (03/29 0700) Temp Source: Core (Comment) (03/29 0800) BP: 110/49 mmHg (03/29 0735) Pulse Rate: 86 (03/29 0735)  Labs:  Recent Labs  10/15/15 0415 10/16/15 0523 10/16/15 1115 10/17/15 0350  HGB 11.5*  --  10.5* 10.6*  HCT 37.2*  --  32.5* 33.1*  PLT 143*  --  124* 124*  HEPARINUNFRC 0.40 0.32  --  0.58  CREATININE 1.77*  --   --   --     Estimated Creatinine Clearance: 54.5 mL/min (by C-G formula based on Cr of 1.77).   Assessment: [age] yom admitted 04-27-16 with STEMI. S/p cardiac arrest. Pt received CPR and 2 episodes w/ ROSC. S/p PCI and IABP inserted 3/22. Pt continues on heparin for ACS/IABP.  -heparin level= 0.58 and above goal  Goal of Therapy:  HL 0.2-0.5 Monitor platelets by anticoagulation protocol: Yes   Plan:   Decrease heparin to 1000 units/hr Daily CBC, HL  Harland Germanndrew Kohana Amble, Pharm D 10/17/2015 9:34 AM

## 2015-10-17 NOTE — Progress Notes (Signed)
PULMONARY / CRITICAL CARE MEDICINE   Name: Clarence Brooks MRN: [id number] DOB: 02-10-1951    ADMISSION DATE:  2016-02-09 CONSULTATION DATE:  05/06/2016  REFERRING MD:  Tresa EndoKelly  CHIEF COMPLAINT:  Cardiac Arrest   SUBJECTIVE:   szes controlled. Off sedatives -- pt is still comfortable.  Cont to be on pressors. Distended abd this am >> tf held. Had a large BM  VITAL SIGNS: BP 110/49 mmHg  Pulse 86  Temp(Src) 99.7 F (37.6 C) (Core (Comment))  Resp 24  Ht 6' (1.829 m)  Wt 240 lb 8.4 oz (109.1 kg)  BMI 32.61 kg/m2  SpO2 95%  HEMODYNAMICS: CVP:  [14 mmHg-20 mmHg] 17 mmHg  VENTILATOR SETTINGS: Vent Mode:  [-] PRVC FiO2 (%):  [80 %-100 %] 80 % Set Rate:  [24 bmp] 24 bmp Vt Set:  [620 mL] 620 mL PEEP:  [10 cmH20-12 cmH20] 10 cmH20 Plateau Pressure:  [29 cmH20-32 cmH20] 30 cmH20  INTAKE / OUTPUT: I/O last 3 completed shifts: In: 6991.8 [I.V.:3516.8; NG/GT:2590; IV Piggyback:885] Out: 977 [Urine:277; Emesis/NG output:700]  PHYSICAL EXAMINATION: General: Adult male, resting in bed, critically ill. (-) obvious szes noted.  Neuro: deep sedation, no response to stimuli. No gag. Pupils 2 mm non reactive. (-) corneals. (+) Dolls eye. (-) withdrawal to deep sternal rub.  HEENT: [place]/AT. PERRL, sclerae anicteric. Cardiovascular: RRR, no M/R/G.  Lungs: Respirations even and unlabored. Crackles mid-bases. Abdomen: BS x 4, soft, NT/ND.  Musculoskeletal: No gross deformities, Gr 2  edema.  Skin: Intact, warm, no rashes.  LABS:  BMET  Recent Labs Lab 10/13/15 0356 10/14/15 0430 10/15/15 0415  NA 144 151* 151*  K 3.6 3.8 4.3  CL 117* 123* 122*  CO2 19* 19* 17*  BUN 9 7 15   CREATININE 1.19 1.28* 1.77*  GLUCOSE 128* 126* 137*    Electrolytes  Recent Labs Lab 10/11/15 0400 10/11/15 1105  10/12/15 0450  10/13/15 0356 10/14/15 0430 10/15/15 0415  CALCIUM 8.6*  8.6* 8.4*  < > 8.2*  < > 8.2* 8.2* 7.9*  MG 2.1 1.9  --  1.8  --  2.0  --   --   PHOS 1.6*  --   --  3.5  --   3.8  --   --   < > = values in this interval not displayed.  CBC  Recent Labs Lab 10/15/15 0415 10/16/15 1115 10/17/15 0350  WBC 8.9 13.4* 10.9*  HGB 11.5* 10.5* 10.6*  HCT 37.2* 32.5* 33.1*  PLT 143* 124* 124*    Coag's  Recent Labs Lab 05/06/2016 2200 10/11/15 0400  APTT 58* 62*  INR 1.20 1.18    Sepsis Markers  Recent Labs Lab 05/06/2016 1936 05/06/2016 2243 10/11/15 0500  LATICACIDVEN 7.67* 2.2* 2.5*    ABG  Recent Labs Lab 10/15/15 1752 10/15/15 2043 10/16/15 0756  PHART 7.178* 7.231* 7.272*  PCO2ART 43.3 38.3 36.6  PO2ART 67.0* 114.0* 131.0*    Liver Enzymes  Recent Labs Lab 10/13/15 1054 10/14/15 0430 10/15/15 0415  AST 48* 39 47*  ALT 106* 85* 67*  ALKPHOS 77 93 108  BILITOT 0.5 0.4 0.4  ALBUMIN 2.2* 2.1* 1.9*    Cardiac Enzymes  Recent Labs Lab 10/11/15 0400 10/11/15 1105 10/11/15 1600  TROPONINI 0.29* 0.34* 0.38*    Glucose  Recent Labs Lab 10/16/15 1546 10/16/15 2034 10/16/15 2038 10/16/15 2349 10/17/15 0328 10/17/15 0756  GLUCAP 244* 252* 260* 251* 206* 148*    Imaging Dg Abd Portable 1v  10/17/2015  CLINICAL DATA:  Acute onset of generalized abdominal distention. Initial encounter. EXAM: PORTABLE ABDOMEN - 1 VIEW COMPARISON:  Abdominal radiograph performed 10-15-15 FINDINGS: The patient's enteric tube is noted ending overlying the body of the stomach. The stomach is diffusely distended with air. There is a relative paucity of bowel gas within the remainder of the abdomen. A small amount of air is noted within the colon. No definite findings are seen to suggest bowel obstruction. No free intra-abdominal air is seen, though evaluation for free air is limited on a single supine view. No acute osseous abnormalities are identified. Chronic degenerative change is noted at both hips. IMPRESSION: Enteric tube noted ending overlying the body of the stomach. Stomach diffusely distended with air. These results were called by  telephone at the time of interpretation on 10/17/2015 at 1:33 am to Nursing on [facility], who verbally acknowledged these results. Electronically Signed   By: Roanna Raider M.D.   On: 10/17/2015 01:35     STUDIES:  CXR 03/22 > no acute process. CXR 3/25 > images personally reviewed, R effusion ECHO 3/23 >> LVEF 20-25%, severe global hypokinesis, mod LVH, diastolic dysfunction, elevated filling pressure EEG 3/24 >> burst suppression pattern with electrographic seizures within bursts, pattern usually associated with poor outcome  CXR 3/27 >> worse CHF, more B effusion  CULTURES: BCx2 3/23 >> (-) Sputum 3/24 >> abundant H influenza UC 3/23 >> (-)  ANTIBIOTICS: Rocephin 3/27 >>  SIGNIFICANT EVENTS: 3/22  Admitted after out of hospital VF arrest.   3/25  Reached re-warming ~ 0200 3/26  Ongoing burst activity on EEG despite heavy (versed , prop 70 mcg, fent 400 + AED's) sedation  LINES/TUBES: ETT 03/22 > R IJ CVL 3/22 > R rad A line 3/22 >   DISCUSSION: 65 y.o. M admitted 03/22 after out of hospital VF arrest.  He was taken to cath lab for emergent cardiac cath and  this revealed severe 3 vessel disease not amenable to PCI.  He had EF of 25% so IABP was placed. Following procedure, he returned to ICU where hypothermia protocol was initiated.  Re-warmed ~0200 3/25.  Szes over the weekend.   ASSESSMENT / PLAN:  CARDIOVASCULAR A:  Out of hospital VF arrest - s/p cardiac cath with reported severe 3 vessel disease not amenable to PCI.  EF of 25% so IABP was placed sCHF - reported EF of ~25%, global hypokinesis, also with Diastolic Dysfxn  Severe 3 vessel CAD  Hx HTN, HLD P:  S/P cooling protocol >> rewarmed.  Pt needs diuresis once BP tolerates. 10L (+) with worse CHF on CXR. Cant diuresce as pt is on 3 pressors. Cardiology following, appreciate input Will need CVTS consult for CABG evaluation pending neuro prognostication Pt on IABP  1: 1.   NEUROLOGIC A:   Acute metabolic  encephalopathy - secondary to cardiac arrest. Severe Anoxi Injury Status Epilepticus -- controlled. Hx anxiety P:   Pt with EEG c/w severe anoxic injury.  Neurology following, appreciate input Observe for sze recurrence  PULMONARY A: Acute hypoxemic hypercarbic respiratory failure - hypercarbia resolved, due to respiratory insufficiency in the setting of cardiac arrest, pulm edema, possible CAP with H influenza in sputum.  Mucus Plugging - resolved.  P:   PRVC, 8 cc/kg VAP prevention measures On rocephin for H influenza. No weaning.   RENAL A:   AKI -- worsening Volume overload Hypokalemia - anticipate worsening during hypothermia protocol Hypocalcemia Hyperchloremia - suspect 2/2 to NS administration AGMA - lactate  P:  Replace electrolytes as indicated Reduce NS to KVO Trend BMP / UOP  Creat climbing, making urine. Needs diuresis once BP tolerates.   GASTROINTESTINAL A:   GI prophylaxis Nutrition P:  SUP: Pantoprazole Cont TF  HEMATOLOGIC A:   Anemia - mild, no evidence of bleeding  VTE Prophylaxis P:  SCD's / heparin gtt per pharmacy  Trend CBC  INFECTIOUS A:   CAP with abundant H. Influenza in sputum  P:  Cont Rocephin Monitor clinically Cultures as above   ENDOCRINE A:   DM  2 TSH wnl  P:   ICU hyperglycemia protocol Lantus 10 units Q24 Observe BS -- may need tighter sugar control.   Code status : DNR. No escalation of care. Family not ready to withdraw care.    I spent 32  minutes of Critical care time with this patient today. No family around.   Pollie Meyer, MD 10/17/2015, 8:20 AM [facility] Pager [phone] After 3 pm or if no answer, call [phone]

## 2015-10-17 NOTE — Progress Notes (Signed)
CSW engaged with Patient's wife and son in the lobby regarding guardianship documents. CSW provided Patient's wife and son with emotional support and brief supportive counseling. Patient's wife reports being very appreciative of CSW's support. Patient's wife has completed guardianship documents and is taking it to an attorney today to move forward with the guardianship process. Patient's wife reports no other needs at this time. CSW signing off. Please contact if new need(s) arise.   Noe GensAshley Gardner, MSW, LCSW [facility] Clinical Social Worker [phone]

## 2015-10-17 NOTE — Progress Notes (Signed)
Cardiologist: Dr. Percival Spanish Subjective:   Out of hospital VF arrest anterolateral STEMI, triple-vessel disease, EF 25%.  Seizures ongoing, family meeting 3/28. Off all sedation. No response.  Objective:  Vital Signs in the last 24 hours: Temp:  [98.8 F (37.1 C)-100.2 F (37.9 C)] 99.7 F (37.6 C) (03/29 0700) Pulse Rate:  [81-92] 86 (03/29 0735) Resp:  [18-26] 24 (03/29 0735) BP: (96-119)/(42-60) 110/49 mmHg (03/29 0735) SpO2:  [89 %-100 %] 95 % (03/29 0735) Arterial Line BP: (92-125)/(44-66) 100/46 mmHg (03/29 0700) FiO2 (%):  [80 %-100 %] 80 % (03/29 0800) Weight:  [240 lb 8.4 oz (109.1 kg)] 240 lb 8.4 oz (109.1 kg) (03/29 0300)  Intake/Output from previous day: 03/28 0701 - 03/29 0700 In: 4148.9 [I.V.:2038.9; NG/GT:1550; IV Piggyback:560] Out: 371 [Urine:75; Emesis/NG output:700]   Physical Exam: General: Sedate, on vent, ill-appearing. Head:  Normocephalic and atraumatic. Ventilator Lungs: Clear to auscultation and percussion. Heart: Normal S1 and S2.  No murmur, rubs or gallops.  Abdomen: soft, non-tender, positive bowel sounds. Extremities: No clubbing or cyanosis. No edema. Balloon pump GU: Foley catheter in place Neurologic: Sedate unresponsive  Lab Results:  Recent Labs  10/16/15 1115 10/17/15 0350  WBC 13.4* 10.9*  HGB 10.5* 10.6*  PLT 124* 124*    Recent Labs  10/15/15 0415  NA 151*  K 4.3  CL 122*  CO2 17*  GLUCOSE 137*  BUN 15  CREATININE 1.77*    Recent Labs  10/15/15 0415  PROT 5.6*  ALBUMIN 1.9*  AST 47*  ALT 67*  ALKPHOS 108  BILITOT 0.4    Imaging: Dg Abd Portable 1v  10/17/2015  CLINICAL DATA:  Acute onset of generalized abdominal distention. Initial encounter. EXAM: PORTABLE ABDOMEN - 1 VIEW COMPARISON:  Abdominal radiograph performed 09/28/2015 FINDINGS: The patient's enteric tube is noted ending overlying the body of the stomach. The stomach is diffusely distended with air. There is a relative paucity of bowel gas  within the remainder of the abdomen. A small amount of air is noted within the colon. No definite findings are seen to suggest bowel obstruction. No free intra-abdominal air is seen, though evaluation for free air is limited on a single supine view. No acute osseous abnormalities are identified. Chronic degenerative change is noted at both hips. IMPRESSION: Enteric tube noted ending overlying the body of the stomach. Stomach diffusely distended with air. These results were called by telephone at the time of interpretation on 10/17/2015 at 1:33 am to Nursing on [facility], who verbally acknowledged these results. Electronically Signed   By: Garald Balding M.D.   On: 10/17/2015 01:35   Personally viewed.   Telemetry: No adverse arrhythmias Personally viewed.    Meds: Scheduled Meds: . antiseptic oral rinse  7 mL Mouth Rinse 10 times per day  . aspirin  81 mg Oral Daily  . atorvastatin  80 mg Oral q1800  . cefTRIAXone (ROCEPHIN)  IV  2 g Intravenous Q24H  . chlorhexidine gluconate (SAGE KIT)  15 mL Mouth Rinse BID  . feeding supplement (PRO-STAT SUGAR FREE 64)  30 mL Per Tube Daily  . free water  200 mL Per Tube 3 times per day  . insulin aspart  2-6 Units Subcutaneous 6 times per day  . insulin glargine  10 Units Subcutaneous Q24H  . lacosamide (VIMPAT) IV  200 mg Intravenous Q12H  . levETIRAcetam  2,000 mg Intravenous Q12H  . pantoprazole sodium  40 mg Per Tube Daily  . phenytoin (DILANTIN) IV  100  mg Intravenous 3 times per day  . sodium chloride flush  10-40 mL Intracatheter Q12H  . valproate sodium  500 mg Intravenous 4 times per day   Continuous Infusions: . sodium chloride 10 mL/hr at 10/17/15 0000  . sodium chloride Stopped (10/14/15 1200)  . dextrose    . feeding supplement (VITAL AF 1.2 CAL) Stopped (10/16/15 2200)  . heparin 1,100 Units/hr (10/16/15 2202)  . norepinephrine (LEVOPHED) Adult infusion 22 mcg/min (10/17/15 0640)  . phenylephrine (NEO-SYNEPHRINE) Adult infusion 90  mcg/min (10/17/15 0650)  . vasopressin (PITRESSIN) infusion - *FOR SHOCK* 0.03 Units/min (10/16/15 1846)   PRN Meds:.acetaminophen, dextrose, fentaNYL (SUBLIMAZE) injection, fentaNYL (SUBLIMAZE) injection, midazolam, midazolam, ondansetron (ZOFRAN) IV, sodium chloride flush  Assessment/Plan:  Active Problems:   Cardiac arrest with ventricular fibrillation (HCC)   ST elevation myocardial infarction (STEMI) ([place])   Cardiac arrest ([place])   Encounter for central line placement   Hypokalemia   High anion gap metabolic acidosis   Encephalopathy acute   Acute on chronic respiratory failure ([place])   Status epilepticus ([place])   Acute respiratory failure ([place])   Severe anoxic-ischemic encephalopathy ([place])  Out of hospital ventricular fibrillatory arrest/cardiac arrest on 10/06/2015 -Had family meeting with critical care team and nursing on 3/28. Expressed the gravity of the situation, poor overall prognosis, worrisome neurologic findings on EEG consistent with severe anoxic brain injury as well as worsening renal function, need for pressor support. Wife understands and does not want patient to suffer more than necessary, DO NOT RESUSCITATE order was made. Reassessing condition continuously.  -Continuing full medical support, including IABP appreciate critical care team oversight at this point.    Severe coronary artery disease -If neurologic status stable, cardiothoracic surgery consult.  Acute systolic heart failure/cardiomyopathy -Intra-aortic balloon pump in place to help provide decreased afterload and increased coronary perfusion. Continuing heparin IV. Placement evaluated on x-ray. -Continuing with full support at this time. -EF 25% with underlying severe coronary artery disease. -Difficult to diuresis as blood pressure decreases. -He is showing signs of renal failure, meager urine output.   Seizure -Multiple medications being utilized see above. Neuro following. -Anoxic brain injury.  Explained to family.  Very concerned about long-term prognosis. He was a very vibrant man according to his wife and family, active in his community, on the go and understandably so his current status is very difficult to swallow.  Critical care time 35 minutes spent with patient, digestion of complex data in the ICU setting in the setting of multisystem organ failure,  care team communication discussion.    SKAINS, [place] 10/17/2015, 8:56 AM

## 2015-10-18 LAB — CBC
HEMATOCRIT: 29.7 % — AB (ref 39.0–52.0)
HEMOGLOBIN: 9.8 g/dL — AB (ref 13.0–17.0)
MCH: 30.4 pg (ref 26.0–34.0)
MCHC: 33 g/dL (ref 30.0–36.0)
MCV: 92.2 fL (ref 78.0–100.0)
PLATELETS: 86 10*3/uL — AB (ref 150–400)
RBC: 3.22 MIL/uL — AB (ref 4.22–5.81)
RDW: 15.4 % (ref 11.5–15.5)
WBC: 16.3 10*3/uL — ABNORMAL HIGH (ref 4.0–10.5)

## 2015-10-18 LAB — GLUCOSE, CAPILLARY
GLUCOSE-CAPILLARY: 123 mg/dL — AB (ref 65–99)
GLUCOSE-CAPILLARY: 144 mg/dL — AB (ref 65–99)
GLUCOSE-CAPILLARY: 155 mg/dL — AB (ref 65–99)
Glucose-Capillary: 156 mg/dL — ABNORMAL HIGH (ref 65–99)
Glucose-Capillary: 145 mg/dL — ABNORMAL HIGH (ref 65–99)
Glucose-Capillary: 136 mg/dL — ABNORMAL HIGH (ref 65–99)

## 2015-10-18 LAB — BASIC METABOLIC PANEL
ANION GAP: 18 — AB (ref 5–15)
BUN: 76 mg/dL — AB (ref 6–20)
CHLORIDE: 108 mmol/L (ref 101–111)
CO2: 11 mmol/L — AB (ref 22–32)
Calcium: 6.4 mg/dL — CL (ref 8.9–10.3)
Creatinine, Ser: 7.35 mg/dL — ABNORMAL HIGH (ref 0.61–1.24)
GFR calc Af Amer: 8 mL/min — ABNORMAL LOW (ref >60)
GFR calc non Af Amer: 7 mL/min — ABNORMAL LOW (ref >60)
GLUCOSE: 158 mg/dL — AB (ref 65–99)
POTASSIUM: 6.2 mmol/L — AB (ref 3.5–5.1)
Sodium: 137 mmol/L (ref 135–145)

## 2015-10-18 LAB — HEPARIN LEVEL (UNFRACTIONATED)
HEPARIN UNFRACTIONATED: 0.85 [IU]/mL — AB (ref 0.30–0.70)
Heparin Unfractionated: 0.88 IU/mL — ABNORMAL HIGH (ref 0.30–0.70)

## 2015-10-18 MED ORDER — INSULIN ASPART 100 UNIT/ML IV SOLN
10.0000 [IU] | Freq: Once | INTRAVENOUS | Status: AC
  Administered 2015-10-18: 10 [IU] via INTRAVENOUS

## 2015-10-18 MED ORDER — SODIUM POLYSTYRENE SULFONATE 15 GM/60ML PO SUSP
30.0000 g | Freq: Once | ORAL | Status: AC
  Administered 2015-10-18: 30 g via ORAL
  Filled 2015-10-18: qty 120

## 2015-10-18 MED ORDER — AMIODARONE HCL IN DEXTROSE 360-4.14 MG/200ML-% IV SOLN
INTRAVENOUS | Status: AC
  Filled 2015-10-18: qty 200

## 2015-10-18 MED ORDER — DEXTROSE 50 % IV SOLN
1.0000 | Freq: Once | INTRAVENOUS | Status: AC
  Administered 2015-10-18: 50 mL via INTRAVENOUS
  Filled 2015-10-18: qty 50

## 2015-10-18 MED ORDER — AMIODARONE LOAD VIA INFUSION
150.0000 mg | Freq: Once | INTRAVENOUS | Status: AC
  Administered 2015-10-18: 150 mg via INTRAVENOUS
  Filled 2015-10-18: qty 83.34

## 2015-10-18 MED ORDER — AMIODARONE HCL IN DEXTROSE 360-4.14 MG/200ML-% IV SOLN
30.0000 mg/h | INTRAVENOUS | Status: DC
  Administered 2015-10-18: 30 mg/h via INTRAVENOUS
  Filled 2015-10-18 (×2): qty 200

## 2015-10-18 MED ORDER — AMIODARONE HCL IN DEXTROSE 360-4.14 MG/200ML-% IV SOLN
60.0000 mg/h | INTRAVENOUS | Status: AC
  Administered 2015-10-18: 60 mg/h via INTRAVENOUS

## 2015-10-20 NOTE — Progress Notes (Signed)
Cardiologist: Dr. Percival Spanish Subjective:   Out of hospital VF arrest anterolateral STEMI, triple-vessel disease, EF 25%.  Seizures ongoing, family meeting 3/28. Off all sedation. No response. Bleeding from around IABP site. Gum oozing. No UOP  Objective:  Vital Signs in the last 24 hours: Temp:  [99.1 F (37.3 C)-100.6 F (38.1 C)] 100 F (37.8 C) (03/30 0800) Pulse Rate:  [37-98] 68 (03/30 0800) Resp:  [0-31] 24 (03/30 0800) BP: (85-129)/(47-115) 85/57 mmHg (03/30 0400) SpO2:  [92 %-96 %] 93 % (03/30 0800) Arterial Line BP: (74-105)/(46-60) 74/54 mmHg (03/30 0800) FiO2 (%):  [80 %] 80 % (03/30 0800) Weight:  [247 lb 12.8 oz (112.4 kg)] 247 lb 12.8 oz (112.4 kg) (03/30 0700)  Intake/Output from previous day: 03/29 0701 - 03/30 0700 In: 3484 [I.V.:2814; NG/GT:50; IV Piggyback:600] Out: 120 [Urine:20; Emesis/NG output:100]   Physical Exam: General: Sedate, on vent, ill-appearing. Head:  Normocephalic and atraumatic. Ventilator Lungs: Clear to auscultation and percussion. Heart: Normal S1 and S2.  No murmur, rubs or gallops.  Abdomen: soft, non-tender, positive bowel sounds. Extremities: No clubbing or cyanosis. No edema. Balloon pump GU: Foley catheter in place Neurologic: Sedate unresponsive  Lab Results:  Recent Labs  10/17/15 0350 November 15, 2015 0412  WBC 10.9* 16.3*  HGB 10.6* 9.8*  PLT 124* 86*    Recent Labs  10/17/15 0900 11/15/15 0412  NA 142 137  K 5.1 6.2*  CL 114* 108  CO2 15* 11*  GLUCOSE 155* 158*  BUN 55* 76*  CREATININE 5.89* 7.35*    Recent Labs  10/17/15 0900  PROT 5.4*  ALBUMIN 1.5*  AST 82*  ALT 53  ALKPHOS 136*  BILITOT 0.6    Imaging: Dg Abd Portable 1v  10/17/2015  CLINICAL DATA:  Acute onset of generalized abdominal distention. Initial encounter. EXAM: PORTABLE ABDOMEN - 1 VIEW COMPARISON:  Abdominal radiograph performed 09/29/2015 FINDINGS: The patient's enteric tube is noted ending overlying the body of the stomach. The  stomach is diffusely distended with air. There is a relative paucity of bowel gas within the remainder of the abdomen. A small amount of air is noted within the colon. No definite findings are seen to suggest bowel obstruction. No free intra-abdominal air is seen, though evaluation for free air is limited on a single supine view. No acute osseous abnormalities are identified. Chronic degenerative change is noted at both hips. IMPRESSION: Enteric tube noted ending overlying the body of the stomach. Stomach diffusely distended with air. These results were called by telephone at the time of interpretation on 10/17/2015 at 1:33 am to Nursing on [facility], who verbally acknowledged these results. Electronically Signed   By: Garald Balding M.D.   On: 10/17/2015 01:35   Personally viewed.   Telemetry: PSVT 180bpm Personally viewed.    Meds: Scheduled Meds: . amiodarone      . antiseptic oral rinse  7 mL Mouth Rinse 10 times per day  . aspirin  81 mg Oral Daily  . atorvastatin  80 mg Oral q1800  . cefTRIAXone (ROCEPHIN)  IV  2 g Intravenous Q24H  . chlorhexidine gluconate (SAGE KIT)  15 mL Mouth Rinse BID  . feeding supplement (PRO-STAT SUGAR FREE 64)  30 mL Per Tube Daily  . free water  200 mL Per Tube 3 times per day  . insulin aspart  2-6 Units Subcutaneous 6 times per day  . insulin glargine  10 Units Subcutaneous Q24H  . lacosamide (VIMPAT) IV  200 mg Intravenous Q12H  .  levETIRAcetam  2,000 mg Intravenous Q12H  . pantoprazole sodium  40 mg Per Tube Daily  . phenytoin (DILANTIN) IV  100 mg Intravenous 3 times per day  . sodium chloride flush  10-40 mL Intracatheter Q12H  . valproate sodium  500 mg Intravenous 4 times per day   Continuous Infusions: . sodium chloride 10 mL/hr at 2015/10/22 0800  . sodium chloride Stopped (10/14/15 1200)  . amiodarone 30 mg/hr (10/22/15 0800)  . dextrose    . feeding supplement (VITAL AF 1.2 CAL) Stopped (10/16/15 2200)  . norepinephrine (LEVOPHED) Adult  infusion 50 mcg/min (10/22/2015 0836)  . phenylephrine (NEO-SYNEPHRINE) Adult infusion 200 mcg/min (2015-10-22 0837)  . vasopressin (PITRESSIN) infusion - *FOR SHOCK* 0.03 Units/min (10-22-2015 0800)   PRN Meds:.acetaminophen (TYLENOL) oral liquid 160 mg/5 mL, dextrose, fentaNYL (SUBLIMAZE) injection, fentaNYL (SUBLIMAZE) injection, midazolam, midazolam, ondansetron (ZOFRAN) IV, sodium chloride flush  Assessment/Plan:  Active Problems:   Cardiac arrest with ventricular fibrillation (HCC)   ST elevation myocardial infarction (STEMI) ([place])   Cardiac arrest ([place])   Encounter for central line placement   Hypokalemia   High anion gap metabolic acidosis   Encephalopathy acute   Acute on chronic respiratory failure ([place])   Status epilepticus ([place])   Acute respiratory failure ([place])   Severe anoxic-ischemic encephalopathy ([place])  Out of hospital ventricular fibrillatory arrest/cardiac arrest on 10/16/2015 -Had family meeting with critical care team and nursing on 3/28. Expressed the gravity of the situation, poor overall prognosis, worrisome neurologic findings on EEG consistent with severe anoxic brain injury as well as worsening renal function, need for pressor support. Wife understands and does not want patient to suffer more than necessary, DO NOT RESUSCITATE order was made.   -Condition has deteriorated. No urine production, ARF, coagulopathy, oozing around IABP site. Will stop heparin IV.    Severe coronary artery disease  Acute systolic heart failure/cardiomyopathy -Intra-aortic balloon pump in place to help provide decreased afterload and increased coronary perfusion. Despite this support, he continues to worsen.  Stopped heparin IV. Placement evaluated on x-ray. -EF 25% with underlying severe coronary artery disease.   Seizure -Multiple medications being utilized see above. Neuro following. -Anoxic brain injury. Explained to family.  He was a very vibrant man according to his wife and  family, active in his community, on the go and understandably so his current status is very difficult to swallow.  Spoke with CCM team, they will be addressing withdrawal today.    Lakeshia Dohner, [place] October 22, 2015, 9:02 AM

## 2015-10-20 NOTE — Progress Notes (Signed)
eLink Physician-Brief Progress Note Patient Name: Clarence Brooks DOB: 01-Mar-1951 MRN: [id number]   Date of Service  12/10/15  HPI/Events of Note  K=6.2  eICU Interventions  1.insulin and d50 ordered 2.kayexelate ordered        Clarence Brooks 12/10/15, 5:16 AM

## 2015-10-20 NOTE — Progress Notes (Signed)
ANTICOAGULATION CONSULT NOTE - Follow Up Consult  Pharmacy Consult for Heparin  Indication: IABP  Allergies  Allergen Reactions  . Lisinopril Swelling and Other (See Comments)    Throat swelling   . Ace Inhibitors Swelling    Patient Measurements: Height: 6' (182.9 cm) Weight: 240 lb 8.4 oz (109.1 kg) IBW/kg (Calculated) : 77.6  Vital Signs: Temp: 99.7 F (37.6 C) (03/30 0400) BP: 85/57 mmHg (03/30 0400) Pulse Rate: 66 (03/30 0400)  Labs:  Recent Labs  10/16/15 0523  10/16/15 1115 10/17/15 0350 10/17/15 0900 09/25/2015 0411 09/19/2015 0412  HGB  --   < > 10.5* 10.6*  --   --  9.8*  HCT  --   --  32.5* 33.1*  --   --  29.7*  PLT  --   --  124* 124*  --   --  PENDING  HEPARINUNFRC 0.32  --   --  0.58  --  0.88*  --   CREATININE  --   --   --   --  5.89*  --   --   < > = values in this interval not displayed.  Estimated Creatinine Clearance: 16.4 mL/min (by C-G formula based on Cr of 5.89).    Assessment: Heparin for IABP, pt now with poor prognosis, Scr markedly increased this AM to 5.89 (from 1.77) indicating multi-system organ failure, no bleeding issues per RN.   Goal of Therapy:  Heparin level 0.2-0.5 units/ml Monitor platelets by anticoagulation protocol: Yes   Plan:  -Decrease heparin to 750 units/hr -1200 HL  Clarence Brooks 10/09/2015,4:35 AM

## 2015-10-20 NOTE — Progress Notes (Signed)
IABP site bleeding.  Manual pressure held for 10 minutes until bleeding stopped.  Dressing reinforced.  Will continue to monitor pt closely.

## 2015-10-20 NOTE — Progress Notes (Signed)
   October 15, 2015 2213  Clinical Encounter Type  Visited With Patient and family together;Health care provider  Visit Type Initial;Spiritual support;Death  Referral From Nurse  Spiritual Encounters  Spiritual Needs Emotional;Grief support  Stress Factors  Family Stress Factors Loss   Chaplain responded to patient's death. Chaplain offered empathic listening, facilitated life review, and offered support. Spiritual care services available as needed.   Alda PonderAdam M Alonna Bartling, Chaplain May 13, 2016 10:14 PM

## 2015-10-20 NOTE — Progress Notes (Signed)
Pt noted to be in asystole with IABP and ventilator. Family made aware. Pt time of death confirmed at 2121 with two RNs Delice Bison(Tara, RN and Devonne DoughtyShanna, Charity fundraiserN). Listened to heart and lung sounds for one full minute. No breath sounds or lung sounds noted. Elink and RRT notified.  Family given time with patient during this time. Chaplin at bedside.

## 2015-10-20 NOTE — Progress Notes (Signed)
PULMONARY / CRITICAL CARE MEDICINE   Name: Clarence Brooks MRN: [id number] DOB: 01-May-1951    ADMISSION DATE:  2015/11/04 CONSULTATION DATE:  04-Nov-2015  REFERRING MD:  Tresa Endo  CHIEF COMPLAINT:  Cardiac Arrest   SUBJECTIVE:   szes controlled. Off sedatives -- pt is still comfortable. Not doing anything.  Cont to be on pressors. Had sinus tachy/SVT overnight.  BP slowly dropping despite pressors.  Oliguric. Clinically getting worse.   VITAL SIGNS: BP 85/57 mmHg  Pulse 57  Temp(Src) 100.2 F (37.9 C) (Core (Comment))  Resp 0  Ht 6' (1.829 m)  Wt 247 lb 12.8 oz (112.4 kg)  BMI 33.60 kg/m2  SpO2 94%  HEMODYNAMICS: CVP:  [13 mmHg-19 mmHg] 17 mmHg  VENTILATOR SETTINGS: Vent Mode:  [-] PRVC FiO2 (%):  [80 %] 80 % Set Rate:  [24 bmp] 24 bmp Vt Set:  [782 mL] 620 mL PEEP:  [10 cmH20] 10 cmH20 Plateau Pressure:  [20 cmH20-31 cmH20] 21 cmH20  INTAKE / OUTPUT: I/O last 3 completed shifts: In: 4931.2 [I.V.:3576.2; Other:20; NG/GT:460; IV Piggyback:875] Out: 835 [Urine:35; Emesis/NG output:800]  PHYSICAL EXAMINATION: General: Adult male, resting in bed, critically ill. (-) obvious szes noted.  Neuro: deep sedation, no response to stimuli. No gag. Pupils 2 mm non reactive. (+) corneals. (+) Dolls eye. (-) withdrawal to deep sternal rub.  HEENT: [place]/AT. PERRL, sclerae anicteric. Cardiovascular: RRR, no M/R/G.  Lungs: Respirations even and unlabored. Crackles mid-bases. Abdomen: BS x 4, soft, NT/ND.  Musculoskeletal: No gross deformities, Gr 2  edema.  Skin: Intact, warm, no rashes.  LABS:  BMET  Recent Labs Lab 10/15/15 0415 10/17/15 0900 09/23/2015 0412  NA 151* 142 137  K 4.3 5.1 6.2*  CL 122* 114* 108  CO2 17* 15* 11*  BUN 15 55* 76*  CREATININE 1.77* 5.89* 7.35*  GLUCOSE 137* 155* 158*    Electrolytes  Recent Labs Lab 10/11/15 1105  10/12/15 0450  10/13/15 0356  10/15/15 0415 10/17/15 0900 10/08/2015 0412  CALCIUM 8.4*  < > 8.2*  < > 8.2*  < > 7.9* 6.7*  6.4*  MG 1.9  --  1.8  --  2.0  --   --   --   --   PHOS  --   --  3.5  --  3.8  --   --   --   --   < > = values in this interval not displayed.  CBC  Recent Labs Lab 10/16/15 1115 10/17/15 0350 10/12/2015 0412  WBC 13.4* 10.9* 16.3*  HGB 10.5* 10.6* 9.8*  HCT 32.5* 33.1* 29.7*  PLT 124* 124* 86*    Coag's No results for input(s): APTT, INR in the last 168 hours.  Sepsis Markers No results for input(s): LATICACIDVEN, PROCALCITON, O2SATVEN in the last 168 hours.  ABG  Recent Labs Lab 10/15/15 1752 10/15/15 2043 10/16/15 0756  PHART 7.178* 7.231* 7.272*  PCO2ART 43.3 38.3 36.6  PO2ART 67.0* 114.0* 131.0*    Liver Enzymes  Recent Labs Lab 10/14/15 0430 10/15/15 0415 10/17/15 0900  AST 39 47* 82*  ALT 85* 67* 53  ALKPHOS 93 108 136*  BILITOT 0.4 0.4 0.6  ALBUMIN 2.1* 1.9* 1.5*    Cardiac Enzymes  Recent Labs Lab 10/11/15 1105 10/11/15 1600  TROPONINI 0.34* 0.38*    Glucose  Recent Labs Lab 10/17/15 0756 10/17/15 1112 10/17/15 1516 10/17/15 1934 09/24/2015 0022 09/20/2015 0356  GLUCAP 148* 120* 108* 98 123* 144*    Imaging No results found.   STUDIES:  CXR 03/22 > no acute process. CXR 3/25 > images personally reviewed, R effusion ECHO 3/23 >> LVEF 20-25%, severe global hypokinesis, mod LVH, diastolic dysfunction, elevated filling pressure EEG 3/24 >> burst suppression pattern with electrographic seizures within bursts, pattern usually associated with poor outcome  CXR 3/27 >> worse CHF, more B effusion  CULTURES: BCx2 3/23 >> (-) Sputum 3/24 >> abundant H influenza UC 3/23 >> (-)  ANTIBIOTICS: Rocephin 3/27 >>  SIGNIFICANT EVENTS: 3/22  Admitted after out of hospital VF arrest.   3/25  Reached re-warming ~ 0200 3/26  Ongoing burst activity on EEG despite heavy (versed @65 , prop 70 mcg, fent 400 + AED's) sedation  LINES/TUBES: ETT 03/22 > R IJ CVL 3/22 > R rad A line 3/22 >   DISCUSSION: 65 y.o. M admitted 03/22 after out of  hospital VF arrest.  He was taken to cath lab for emergent cardiac cath and  this revealed severe 3 vessel disease not amenable to PCI.  He had EF of 25% so IABP was placed. Following procedure, he returned to ICU where hypothermia protocol was initiated.  Re-warmed ~0200 3/25.  Szes over the weekend.   ASSESSMENT / PLAN:  CARDIOVASCULAR A:  Out of hospital VF arrest - s/p cardiac cath with reported severe 3 vessel disease not amenable to PCI.  EF of 25% so IABP was placed sCHF - reported EF of ~25%, global hypokinesis, also with Diastolic Dysfxn  Severe 3 vessel CAD  Hx HTN, HLD P:  S/P cooling protocol >> rewarmed.  Cant diuresce as pt is on 3 pressors. Clinically getting worse. Low bp despite pressors. Oliguric.  Cardiology following, appreciate input Pt on IABP  1: 1.   NEUROLOGIC A:   Acute metabolic encephalopathy - secondary to cardiac arrest. Severe Anoxic Injury Status Epilepticus -- controlled. Hx anxiety P:   Pt with EEG c/w severe anoxic injury. Neurology following, appreciate input Observe for sze recurrence  PULMONARY A: Acute hypoxemic hypercarbic respiratory failure - hypercarbia resolved, due to respiratory insufficiency in the setting of cardiac arrest, pulm edema, possible CAP with H influenza in sputum.  Mucus Plugging - resolved.  P:   PRVC, 8 cc/kg VAP prevention measures On rocephin for H influenza. No weaning.   RENAL A:   AKI -- worsening. Oliguric.  Volume overload Hypokalemia - anticipate worsening during hypothermia protocol Hypocalcemia Hyperchloremia - suspect 2/2 to NS administration AGMA - lactate  P:   Replace electrolytes as indicated Reduce NS to KVO Trend BMP / UOP  Creat climbing, making urine. Oliguric.   GASTROINTESTINAL A:   GI prophylaxis Nutrition P:  SUP: Pantoprazole Cont TF  HEMATOLOGIC A:   Anemia - mild, no evidence of bleeding  VTE Prophylaxis P:  SCD's / heparin gtt per pharmacy  Trend  CBC  INFECTIOUS A:   CAP with abundant H. Influenza in sputum  P:  Cont Rocephin Monitor clinically Cultures as above   ENDOCRINE A:   DM  2 TSH wnl  P:   ICU hyperglycemia protocol Lantus 10 units Q24 Observe BS -- may need tighter sugar control.   Code status : DNR. No escalation of care. Family not ready to withdraw care.  3/30 Pt last 24 hrs has clinically worsened -- lower BP, oliguric. Will talk to family re: withdraWAL of care.    I spent 35  minutes of Critical care time with this patient today. No family around.   Pollie MeyerJ. Angelo A de Dios, MD 09/24/2015, 8:32 AM [facility] Pager [phone] After 3 pm or if no answer, call [phone]

## 2015-10-20 NOTE — Progress Notes (Signed)
[facility] Not[facility] per RN that pt has expired. Family at bedside on my arrival. Noted pt to have No respirations, and no cardiac activity. Pt was extubated per Family request. Family wanted the ETtube removed. Chaplin at the bedside with the family. RN aware.

## 2015-10-20 NOTE — Progress Notes (Signed)
eLink Physician-Brief Progress Note Patient Name: Clarence Brooks DOB: 09/12/1950 MRN: [id number]   Date of Service  03/03/16  HPI/Events of Note  Called for wide complex tachycardia HR 180's  eICU Interventions  Will order amiodarone bolus and infusion Prognosis is poor, patient probably not survive tonight, patient is DNR     Intervention Category Major Interventions: Arrhythmia - evaluation and management Evaluation Type: Other  Tamyra Fojtik 03/03/16, 12:26 AM

## 2015-10-20 NOTE — Progress Notes (Signed)
Interval History:                                                                                                                 Fredirick MaudlinWilson Goza is an 65 y.o. 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 Continues to be on ventilator support, no sedative medications on board       Due to abnormal labs with elevated creatinine up to 7, thrombocytopenia, his seizure medications have been discontinued this morning.   No abnormal involuntary movements or clinical seizures noted.   Past Medical History: Past Medical History  Diagnosis Date  . Diabetes mellitus without complication (HCC)   . Hypertension   . Dyslipidemia   . Dizziness   . Anxiety   . Osteoarthritis   . Special screening for malignant neoplasm of prostate   . Encounter for long-term (current) use of other medications     Past Surgical History  Procedure Laterality Date  . Knee surgery      bilateral  . Umbilical hernia repair    . Carpal tunnel release      bilateral  . Ankle surgery    . Cardiac catheterization N/A October 16, 2015    Procedure: Left Heart Cath and Coronary Angiography;  Surgeon: Lennette Biharihomas A Kelly, MD;  [facility];  Service: Cardiovascular;  Laterality: N/A;  . Cardiac catheterization N/A October 16, 2015    Procedure: IABP Insertion;  Surgeon: Lennette Biharihomas A Kelly, MD;  Location: [facility] INVASIVE CV LAB;  Service: Cardiovascular;  Laterality: N/A;    Family History: Family History  Problem Relation Age of Onset  . Hypertension Mother   . Cancer Father     Throat  . Hypertension Brother     Social History:   reports that he has never smoked. He does not have any smokeless tobacco history on file. His alcohol and drug histories are not on file.  Allergies:  Allergies  Allergen Reactions  . Lisinopril Swelling and Other (See Comments)    Throat swelling   . Ace Inhibitors Swelling     Medications:                                                                                                                          Current facility-administered medications:  .  0.9 %  sodium chloride infusion, , Intravenous, Continuous, Jeanella CrazeBrandi L Ollis, NP, Last Rate: 10 mL/hr at 10/15/2015 1120 .  0.9 %  sodium chloride infusion, ,  Intravenous, Continuous, Jeanella Craze, NP, Stopped at 10/14/15 1200 .  acetaminophen (TYLENOL) solution 650 mg, 650 mg, Oral, Q4H PRN, Jose Angelo A Christene Slates, MD .  [COMPLETED] amiodarone (NEXTERONE) 1.8 mg/mL load via infusion 150 mg, 150 mg, Intravenous, Once, 150 mg at 09/19/2015 0035 **FOLLOWED BY** [EXPIRED] amiodarone (NEXTERONE PREMIX) 360 MG/200ML (1.8 mg/mL) IV infusion, 60 mg/hr, Intravenous, Continuous, Stopped at 10/06/2015 0700 **FOLLOWED BY** amiodarone (NEXTERONE PREMIX) 360 MG/200ML (1.8 mg/mL) IV infusion, 30 mg/hr, Intravenous, Continuous, Erin Fulling, MD, Last Rate: 16.7 mL/hr at 10/05/2015 1128, 30 mg/hr at 10/03/2015 1128 .  antiseptic oral rinse solution (CORINZ), 7 mL, Mouth Rinse, 10 times per day, Roslynn Amble, MD, 7 mL at 10/14/2015 1600 .  aspirin chewable tablet 81 mg, 81 mg, Oral, Daily, Lennette Bihari, MD, 81 mg at 10/01/2015 1015 .  atorvastatin (LIPITOR) tablet 80 mg, 80 mg, Oral, q1800, Lennette Bihari, MD, 80 mg at 10/17/15 1800 .  cefTRIAXone (ROCEPHIN) 2 g in dextrose 5 % 50 mL IVPB, 2 g, Intravenous, Q24H, Jose Angelo A Christene Slates, MD, 2 g at 09/27/2015 1017 .  chlorhexidine gluconate (PERIDEX) 0.12 % solution 15 mL, 15 mL, Mouth Rinse, BID, Roslynn Amble, MD, 15 mL at 09/27/2015 0800 .  dextrose 10 % infusion, , Intravenous, Continuous PRN, Roslynn Amble, MD .  fentaNYL (SUBLIMAZE) injection 50 mcg, 50 mcg, Intravenous, Q15 min PRN, Jose Angelo A de [place], MD .  fentaNYL (SUBLIMAZE) injection 50 mcg, 50 mcg, Intravenous, Q2H PRN, Jose Angelo A de [place], MD .  free water 200 mL, 200 mL, Per Tube, 3 times per day, Lupita Leash, MD, 200 mL at 09/20/2015 1400 .  insulin aspart (novoLOG) injection 2-6 Units, 2-6  Units, Subcutaneous, 6 times per day, Roslynn Amble, MD, 2 Units at 09/30/2015 1620 .  insulin glargine (LANTUS) injection 10 Units, 10 Units, Subcutaneous, Q24H, Roslynn Amble, MD, 10 Units at 10/17/15 2234 .  midazolam (VERSED) injection 2 mg, 2 mg, Intravenous, Q15 min PRN, Jose Angelo A de [place], MD .  midazolam (VERSED) injection 2 mg, 2 mg, Intravenous, Q2H PRN, Jose Angelo A de [place] Chris, MD .  norepinephrine (LEVOPHED) 16 mg in dextrose 5 % 250 mL (0.064 mg/mL) infusion, 0-50 mcg/min, Intravenous, Titrated, Alyson Reedy, MD, Last Rate: 46.9 mL/hr at 10/14/2015 0836, 50 mcg/min at 10/11/2015 0836 .  ondansetron (ZOFRAN) injection 4 mg, 4 mg, Intravenous, Q6H PRN, Lennette Bihari, MD .  pantoprazole sodium (PROTONIX) 40 mg/20 mL oral suspension 40 mg, 40 mg, Per Tube, Daily, Sherron Monday, RPH, 40 mg at 09/30/2015 1017 .  phenylephrine (NEO-SYNEPHRINE) 40 mg in dextrose 5 % 250 mL (0.16 mg/mL) infusion, 30-200 mcg/min, Intravenous, Continuous, Jose Angelo A de [place] Chris, MD, Last Rate: 75 mL/hr at 10/14/2015 1534, 200 mcg/min at 09/27/2015 1534 .  sodium chloride flush (NS) 0.9 % injection 10-40 mL, 10-40 mL, Intracatheter, Q12H, Roslynn Amble, MD, 10 mL at 10/17/15 2238 .  sodium chloride flush (NS) 0.9 % injection 10-40 mL, 10-40 mL, Intracatheter, PRN, Roslynn Amble, MD .  vasopressin (PITRESSIN) 40 Units in sodium chloride 0.9 % 250 mL (0.16 Units/mL) infusion, 0.03 Units/min, Intravenous, Continuous, Jose Angelo A de [place], MD, Last Rate: 11.3 mL/hr at 09/29/2015 1200, 0.03 Units/min at 09/29/2015 1200   Neurologic Examination:  Today's Vitals   09/19/2015 1545 10/11/2015 1600 09/19/2015 1615 09/28/2015 1640  BP:    172/43  Pulse: 56 62 63 57  Temp: 99 F (37.2 C) 99 F (37.2 C) 98.8 F (37.1 C)   TempSrc:  Core (Comment)    Resp: Height:      Weight:      SpO2: 92% 85% 89% 92%    Intubated, no sedation. Bilateral pupils 9 mm, right pupil nonreactive, left pupil minimally reactive, bilateral corneal responses noted, no gag reflex. No motor response to stimulation.   Lab Results: Basic Metabolic Panel:  Recent Labs Lab 10/12/15 0450  10/13/15 0356 10/14/15 0430 10/15/15 0415 10/17/15 0900 10/04/2015 0412  NA 141  < > 144 151* 151* 142 137  K 3.6  < > 3.6 3.8 4.3 5.1 6.2*  CL 115*  < > 117* 123* 122* 114* 108  CO2 18*  < > 19* 19* 17* 15* 11*  GLUCOSE 157*  < > 128* 126* 137* 155* 158*  BUN 10  < > 55* 76*  CREATININE 0.76  < > 1.19 1.28* 1.77* 5.89* 7.35*  CALCIUM 8.2*  < > 8.2* 8.2* 7.9* 6.7* 6.4*  MG 1.8  --  2.0  --   --   --   --   PHOS 3.5  --  3.8  --   --   --   --   < > = values in this interval not displayed.  Liver Function Tests:  Recent Labs Lab 10/13/15 1054 10/14/15 0430 10/15/15 0415 10/17/15 0900  AST 48* 39 47* 82*  ALT 106* 85* 67* 53  ALKPHOS 77 93 108 136*  BILITOT 0.5 0.4 0.4 0.6  PROT 5.4* 5.6* 5.6* 5.4*  ALBUMIN 2.2* 2.1* 1.9* 1.5*   No results for input(s): LIPASE, AMYLASE in the last 168 hours.  Recent Labs Lab 10/13/15 1145  AMMONIA 34    CBC:  Recent Labs Lab 10/14/15 0430 10/15/15 0415 10/16/15 1115 10/17/15 0350 09/22/2015 0412  WBC 10.4 8.9 13.4* 10.9* 16.3*  HGB 11.6* 11.5* 10.5* 10.6* 9.8*  HCT 37.1* 37.2* 32.5* 33.1* 29.7*  MCV 95.4 97.1 95.6 94.3 92.2  PLT 158 143* 124* 124* 86*    Cardiac Enzymes: No results for input(s): CKTOTAL, CKMB, CKMBINDEX, TROPONINI in the last 168 hours.  Lipid Panel:  Recent Labs Lab 10/15/15 0415  TRIG 345*    CBG:  Recent Labs Lab 09/19/2015 0022 10/13/2015 0356 09/21/2015 0748 10/01/2015 1221 10/06/2015 1616  GLUCAP 123* 144* 155* 156* 145*    Microbiology: Results for orders placed or performed during the hospital encounter of 10/31/2015  MRSA PCR Screening     Status: None   Collection Time: October 31, 2015 10:28 PM  Result Value Ref Range Status    MRSA by PCR NEGATIVE NEGATIVE Final    Comment:        The GeneXpert MRSA Assay (FDA approved for NASAL specimens only), is one component of a comprehensive MRSA colonization surveillance program. It is not intended to diagnose MRSA infection nor to guide or monitor treatment for MRSA infections.   Culture, Urine     Status: None   Collection Time: 10/11/15 11:02 PM  Result Value Ref Range Status   Specimen Description URINE, CATHETERIZED  Final   Special Requests Normal  Final   Culture NO GROWTH 1 DAY  Final   Report Status 10/14/2015 FINAL  Final  Culture, blood (routine x 2)  Status: None   Collection Time: 10/11/15 11:15 PM  Result Value Ref Range Status   Specimen Description BLOOD LEFT ANTECUBITAL  Final   Special Requests IN PEDIATRIC BOTTLE 1CC  Final   Culture NO GROWTH 5 DAYS  Final   Report Status 10/17/2015 FINAL  Final  Culture, blood (routine x 2)     Status: None   Collection Time: 10/11/15 11:33 PM  Result Value Ref Range Status   Specimen Description BLOOD RIGHT HAND  Final   Special Requests IN PEDIATRIC BOTTLE 1CC  Final   Culture NO GROWTH 5 DAYS  Final   Report Status 10/17/2015 FINAL  Final  Culture, respiratory (NON-Expectorated)     Status: None   Collection Time: 10/12/15  1:10 AM  Result Value Ref Range Status   Specimen Description TRACHEAL ASPIRATE  Final   Special Requests Normal  Final   Gram Stain   Final    ABUNDANT WBC PRESENT,BOTH PMN AND MONONUCLEAR RARE SQUAMOUS EPITHELIAL CELLS PRESENT ABUNDANT GRAM NEGATIVE COCCOBACILLI RARE GRAM POSITIVE COCCI IN PAIRS Performed at [facility]    Culture   Final    ABUNDANT HAEMOPHILUS INFLUENZAE Note: BETA LACTAMASE NEGATIVE FEW STREPTOCOCCUS GROUP C Note: Beta hemolytic streptococci are predictably susceptible to penicillin and other beta lactams. Susceptibility testing not routinely performed. Performed at [facility]    Report Status 10/14/2015 FINAL  Final     Imaging: Dg Chest Port 1 View  10/15/2015  CLINICAL DATA:  Acute on chronic respiratory failure, cardiac arrest, STEMI EXAM: PORTABLE CHEST 1 VIEW COMPARISON:  Portable chest x-ray of October 14, 2015 FINDINGS: The lungs are well-expanded. There are layering pleural effusions posteriorly greatest on the right. The retrocardiac region is dense. The cardiac silhouette is mildly enlarged. The pulmonary vascularity is engorged and indistinct. The endotracheal tube tip lies approximately 4.6 cm above the carina. The esophagogastric tube tip projects below the inferior margin of the image. The right internal jugular venous catheter tip projects over the proximal portion of the SVC. An external pacemaker defibrillator pad is present. IMPRESSION: Worsening of CHF with bilateral pleural effusions, interstitial edema, and left lower lobe atelectasis. The support tubes are in reasonable position. Electronically Signed   By: David  [place] M.D.   On: 10/15/2015 07:17   Dg Abd Portable 1v  10/17/2015  CLINICAL DATA:  Acute onset of generalized abdominal distention. Initial encounter. EXAM: PORTABLE ABDOMEN - 1 VIEW COMPARISON:  Abdominal radiograph performed 11/06/2015 FINDINGS: The patient's enteric tube is noted ending overlying the body of the stomach. The stomach is diffusely distended with air. There is a relative paucity of bowel gas within the remainder of the abdomen. A small amount of air is noted within the colon. No definite findings are seen to suggest bowel obstruction. No free intra-abdominal air is seen, though evaluation for free air is limited on a single supine view. No acute osseous abnormalities are identified. Chronic degenerative change is noted at both hips. IMPRESSION: Enteric tube noted ending overlying the body of the stomach. Stomach diffusely distended with air. These results were called by telephone at the time of interpretation on 10/17/2015 at 1:33 am to Nursing on [facility], who verbally  acknowledged these results. Electronically Signed   By: Roanna Raider M.D.   On: 10/17/2015 01:35    Assessment and plan:   Kasch Borquez is an 65 y.o. male patient with with severe anoxic brain injury. He has preserved corneal's reflexes and minimally reactive left pupil otherwise, absent  gag reflex and no motor response. His systolic blood pressures are in 50s with severe coronary artery disease, EF around 25% . Very poor prognosis for neurological recovery.  Due to abnormal labs elevated creatinine thrombus cytopenia, with no benefit noted with the current antiepileptic medications, given the grave prognosis, recommend discontinuing all seizure medications. No family at bedside. We'll be available to discuss with family as needed.  Otherwise no further recommendations from neurology. We'll sign off.

## 2015-10-20 NOTE — Progress Notes (Signed)
Bleeding noted from around IABP site.  Manual pressure held for 15 minutes, bleeding stopped.  Pharmacy and  Dr. Anne FuSkains notified of bleeding at site, as well as at gums. To send heparin level.  Will continue to monitor pt closely.

## 2015-10-20 NOTE — Discharge Summary (Signed)
PULMONARY / CRITICAL CARE MEDICINE   Name: Clarence Brooks MRN: [id number] DOB: 11/30/1950    ADMISSION DATE:  10/13/2015 CONSULTATION DATE:  09/29/2015  REFERRING MD:  Claiborne Billings  CHIEF COMPLAINT:  Cardiac Arrest  HISTORY OF PRESENT ILLNESS:  Pt is encephelopathic; therefore, this HPI is obtained from chart review. Clarence Brooks is a 65 y.o. male with PMH as outlined below including HTN, HLD, DM2, obesity.  He was brought to [facility] ED 03/22 via EMS after a witnessed out of hospital VF arrest with EKG revealing anterolateral STEMI.  CPR was apparently immediately started on the scene and on EMS arrival, he was given 2 rounds of epi as well as 1 defibrillation prior to ROSC.  He had king airway placed in the field and this was switched to ETT in ED.  On arrival to ED, he was met by cardiology team who took him for emergent cardiac cath. Per RN report, pt had severe 3 vessel disease that was not amenable to PCI.  He had IABP placed for EF of roughly 25% and will need CVTS input.  Following cath, he returned to the ICU and hypothermia protocol was initiated.  PAST MEDICAL HISTORY :  He  has a past medical history of Diabetes mellitus without complication ([place]); Hypertension; Dyslipidemia; Dizziness; Anxiety; Osteoarthritis; Special screening for malignant neoplasm of prostate; and Encounter for long-term (current) use of other medications.  PAST SURGICAL HISTORY: He  has past surgical history that includes Knee surgery; Umbilical hernia repair; Carpal tunnel release; Ankle surgery; Cardiac catheterization (N/A, 09/22/2015); and Cardiac catheterization (N/A, 09/25/2015).  Allergies  Allergen Reactions  . Lisinopril Swelling and Other (See Comments)    Throat swelling   . Ace Inhibitors Swelling    No current facility-administered medications on file prior to encounter.   Current Outpatient Prescriptions on File Prior to Encounter  Medication Sig  . hydrochlorothiazide (HYDRODIURIL) 25 MG tablet Take  25 mg by mouth daily.  . insulin aspart (NOVOLOG) 100 UNIT/ML injection Inject 15 Units into the skin 3 (three) times daily with meals.  . Insulin Glargine (LANTUS OPTICLIK) 100 UNIT/ML SOCT Inject 28 Units into the skin daily.    FAMILY HISTORY:  His indicated that his mother is alive. He indicated that his father is deceased. He indicated that his brother is alive.   SOCIAL HISTORY: He  reports that he has never smoked. He does not have any smokeless tobacco history on file.  REVIEW OF SYSTEMS:   Unable to obtain as pt is encephalopathic.  SUBJECTIVE:  On vent, unresponsive.  VITAL SIGNS: BP 46/33 mmHg  Pulse 29  Temp(Src) 98.1 F (36.7 C) (Core (Comment))  Resp 24  Ht 6' (1.829 m)  Wt 247 lb 12.8 oz (112.4 kg)  BMI 33.60 kg/m2  SpO2 94%  HEMODYNAMICS: CVP:  [21 mmHg-22 mmHg] 22 mmHg  VENTILATOR SETTINGS: Vent Mode:  [-] PRVC FiO2 (%):  [80 %-100 %] 100 % Set Rate:  [24 bmp] 24 bmp Vt Set:  [767 mL] 620 mL PEEP:  [10 cmH20] 10 cmH20 Plateau Pressure:  [26 cmH20-29 cmH20] 26 cmH20  INTAKE / OUTPUT: I/O last 3 completed shifts: In: 1969.8 [I.V.:1694.8; IV Piggyback:275] Out: -    PHYSICAL EXAMINATION: General: Adult AA male, resting in bed, critically ill. Neuro: Sedated, does not follow commands. HEENT: [place]/AT. PERRL, sclerae anicteric. Cardiovascular: RRR, no M/R/G.  Lungs: Respirations even and unlabored.  CTA bilaterally, No W/R/R. Abdomen: BS x 4, soft, NT/ND.  Musculoskeletal: No gross deformities, no edema.  Skin: Intact, warm, no rashes.     LABS:  BMET  Recent Labs Lab 10/15/15 0415 10/17/15 0900 10/20/15 0412  NA 151* 142 137  K 4.3 5.1 6.2*  CL 122* 114* 108  CO2 17* 15* 11*  BUN 15 55* 76*  CREATININE 1.77* 5.89* 7.35*  GLUCOSE 137* 155* 158*    Electrolytes  Recent Labs Lab 10/13/15 0356  10/15/15 0415 10/17/15 0900 Oct 20, 2015 0412  CALCIUM 8.2*  < > 7.9* 6.7* 6.4*  MG 2.0  --   --   --   --   PHOS 3.8  --   --   --    --   < > = values in this interval not displayed.  CBC  Recent Labs Lab 10/16/15 1115 10/17/15 0350 2015-10-20 0412  WBC 13.4* 10.9* 16.3*  HGB 10.5* 10.6* 9.8*  HCT 32.5* 33.1* 29.7*  PLT 124* 124* 86*    Coag's No results for input(s): APTT, INR in the last 168 hours.  Sepsis Markers No results for input(s): LATICACIDVEN, PROCALCITON, O2SATVEN in the last 168 hours.  ABG  Recent Labs Lab 10/15/15 1752 10/15/15 2043 10/16/15 0756  PHART 7.178* 7.231* 7.272*  PCO2ART 43.3 38.3 36.6  PO2ART 67.0* 114.0* 131.0*    Liver Enzymes  Recent Labs Lab 10/14/15 0430 10/15/15 0415 10/17/15 0900  AST 39 47* 82*  ALT 85* 67* 53  ALKPHOS 93 108 136*  BILITOT 0.4 0.4 0.6  ALBUMIN 2.1* 1.9* 1.5*    Cardiac Enzymes No results for input(s): TROPONINI, PROBNP in the last 168 hours.  Glucose  Recent Labs Lab Oct 20, 2015 0022 10/20/2015 0356 2015-10-20 0748 2015-10-20 1221 October 20, 2015 1616 2015/10/20 1955  GLUCAP 123* 144* 155* 156* 145* 136*    Imaging No results found.   STUDIES:  CXR 03/22 > no acute process.  CULTURES: None.  ANTIBIOTICS: None.  SIGNIFICANT EVENTS: 03/22 > admitted after out of hospital VF arrest.    LINES/TUBES: ETT 03/22 > CVL pending 03/22 > A line pending 03/22 >  DISCUSSION: 65 y.o. M admitted 03/22 after out of hospital VF arrest.  He was taken to cath lab for emergent cardiac cath and per RN, this revealed severe 3 vessel disease not amenable to PCI.  He had EF of 25% so IABP was placed. Following procedure, he returned to ICU where hypothermia protocol was initiated.  ASSESSMENT / PLAN:  CARDIOVASCULAR A:  Out of hospital VF arrest - s/p cardiac cath with reported severe 3 vessel disease not amenable to PCI.  He had EF of 25% so IABP was placed. sCHF - reported EF of ~25% during cardiac cath - official report pending. Hx HTN, HLD. P:  Initiate hypothermia protocol, goal temp 33C. Goal MAP > 80 during hypothermia  protocol. Levophed as needed for above goal. Trend troponins / lactate. Assess echo. Cardiology following. Will need CVTS consult for CABG evaluation.  NEUROLOGIC A:   Acute metabolic encephalopathy. Hx anxiety. P:   Sedation:  Cisatracurium gtt / Fentanyl gtt / Midazolam gtt. RASS goal: -5 during hypothermia protocol. Hold daily WUA while under paralysis. EEG. Neuro consult once rewarmed.  PULMONARY A: VDRF - due to respiratory insufficiency in the setting of cardiac arrest. P:   Full vent support. Wean as able. VAP prevention measures. Hold SBT until off paralytics. Albuterol PRN. CXR in AM.  RENAL A:   Hypokalemia - anticipate worsening during hypothermia protocol. Hypocalcemia. AGMA - lactate. P:   BMP q2hrs x 4.  Replace electrolytes as indicated. NS @  100. 1g Ca gluconate. BMP in AM.  GASTROINTESTINAL A:   GI prophylaxis. Nutrition. P:  SUP: Pantoprazole. NPO.  HEMATOLOGIC A:   VTE Prophylaxis. P:  SCD's / heparin. Coags q8hrs x 2. CBC in AM.  INFECTIOUS A:   No indication of infection. P:   Monitor clinically.  ENDOCRINE A:   DM  2. P:   ICU hyperglycemia protocol. Assess TSH.   Family updated: Wife updated.  Interdisciplinary Family Meeting v Palliative Care Meeting:  Due by: 03/28.  CC time: 45 minutes.   Montey Hora, [place] Pager: [phone]  or [phone]   Attestation signed by Rigoberto Noel, MD at 10/11/2015 12:10 AM (Updated)  ATTENDING NOTE: I have personally reviewed patient's available data, including medical history, events of note, physical examination and test results as part of my evaluation. I have discussed with resident/NP and other careteam providers such as pharmacist, RN and RRT & co-ordinated with consultants.   65 year old hypertensive diabetic brought in after a witnessed out of hospital cardiac arrest requiring 2 rounds of epinephrine and  defibrillation for ROSC, EKG showed anterolateral ST elevation,cath Showed diffuse CAD, balloon pump was inserted, EF was about 25%  On exam-sedated, being cold with temperature 33.7, paralyzed, pupils 3 mm reactive to light, on norepinephrine at 15 mics, soft nontender abdomen, bilateral clear breath sounds  Labs and imaging reviewed -lactic acidosis noted   Impression/plan -  Cardiogenic shock , V. fib arrest due to STEMI - continue balloon pump and Levophed drip  He will need cardiothoracic surgical consult if he survives  Monitor pulse on right lower extremity  Acute respiratory failure-ventilator settings were reviewed and adjusted  At risk anoxic encephalopathy -hypothermia protocol , he will be called for another 24 hours and then rewarmed and then assess neurologically  maintain on fentanyl and Versed infusions while on paralytic   Wife was updated in detail   Rest per NP/medical resident whose note is outlined above and that I agree with and edited in full.   The patient is critically ill with multiple organ systems failure and requires high complexity decision making for assessment and support, frequent evaluation and titration of therapies, application of advanced monitoring technologies and extensive interpretation of multiple databases. Critical Care Time devoted to patient care services described in this note independent of APP time is 45 minutes.  This reflects care rendered from 11p-11 11   ALVA,RAKESH V. MD     Pt became  Clinically worsened. Pls see last progress note below.   SUBJECTIVE:  szes controlled. Off sedatives -- pt is still comfortable. Not doing anything.  Cont to be on pressors. Had sinus tachy/SVT overnight.  BP slowly dropping despite pressors.  Oliguric. Clinically getting worse.   VITAL SIGNS: BP 85/57 mmHg  Pulse 57  Temp(Src) 100.2 F (37.9 C) (Core (Comment))  Resp 0  Ht 6' (1.829 m)  Wt 247 lb 12.8 oz (112.4 kg)  BMI  33.60 kg/m2  SpO2 94%  HEMODYNAMICS: CVP: [13 mmHg-19 mmHg] 17 mmHg  VENTILATOR SETTINGS: Vent Mode: [-] PRVC FiO2 (%): [80 %] 80 % Set Rate: [24 bmp] 24 bmp Vt Set: [100 mL] 620 mL PEEP: [10 cmH20] 10 cmH20 Plateau Pressure: [20 cmH20-31 cmH20] 21 cmH20  INTAKE / OUTPUT: I/O last 3 completed shifts: In: 4931.2 [I.V.:3576.2; Other:20; NG/GT:460; IV Piggyback:875] Out: 835 [Urine:35; Emesis/NG output:800]  PHYSICAL EXAMINATION: General: Adult male, resting in bed, critically ill. (-)  obvious szes noted.  Neuro: deep sedation, no response to stimuli. No gag. Pupils 2 mm non reactive. (+) corneals. (+) Dolls eye. (-) withdrawal to deep sternal rub.  HEENT: [place]/AT. PERRL, sclerae anicteric. Cardiovascular: RRR, no M/R/G.  Lungs: Respirations even and unlabored. Crackles mid-bases. Abdomen: BS x 4, soft, NT/ND.  Musculoskeletal: No gross deformities, Gr 2 edema.  Skin: Intact, warm, no rashes.  LABS:  BMET  Last Labs      Recent Labs Lab 10/15/15 0415 10/17/15 0900 11-02-2015 0412  NA 151* 142 137  K 4.3 5.1 6.2*  CL 122* 114* 108  CO2 17* 15* 11*  BUN 15 55* 76*  CREATININE 1.77* 5.89* 7.35*  GLUCOSE 137* 155* 158*      Electrolytes  Last Labs      Recent Labs Lab 10/11/15 1105  10/12/15 0450  10/13/15 0356  10/15/15 0415 10/17/15 0900 2015/11/02 0412  CALCIUM 8.4* < > 8.2* < > 8.2* < > 7.9* 6.7* 6.4*  MG 1.9 --  1.8 --  2.0 --  --  --  --   PHOS --  --  3.5 --  3.8 --  --  --  --   < > = values in this interval not displayed.    CBC  Last Labs      Recent Labs Lab 10/16/15 1115 10/17/15 0350 2015-11-02 0412  WBC 13.4* 10.9* 16.3*  HGB 10.5* 10.6* 9.8*  HCT 32.5* 33.1* 29.7*  PLT 124* 124* 86*      Coag's  Last Labs     No results for input(s): APTT, INR in the last 168 hours.    Sepsis Markers  Last Labs     No results  for input(s): LATICACIDVEN, PROCALCITON, O2SATVEN in the last 168 hours.    ABG  Last Labs      Recent Labs Lab 10/15/15 1752 10/15/15 2043 10/16/15 0756  PHART 7.178* 7.231* 7.272*  PCO2ART 43.3 38.3 36.6  PO2ART 67.0* 114.0* 131.0*      Liver Enzymes  Last Labs      Recent Labs Lab 10/14/15 0430 10/15/15 0415 10/17/15 0900  AST 39 47* 82*  ALT 85* 67* 53  ALKPHOS 93 108 136*  BILITOT 0.4 0.4 0.6  ALBUMIN 2.1* 1.9* 1.5*      Cardiac Enzymes  Last Labs      Recent Labs Lab 10/11/15 1105 10/11/15 1600  TROPONINI 0.34* 0.38*      Glucose  Last Labs      Recent Labs Lab 10/17/15 0756 10/17/15 1112 10/17/15 1516 10/17/15 1934 Nov 02, 2015 0022 11-02-2015 0356  GLUCAP 148* 120* 108* 98 123* 144*      Imaging No results found.   STUDIES:  CXR 03/22 > no acute process. CXR 3/25 > images personally reviewed, R effusion ECHO 3/23 >> LVEF 20-25%, severe global hypokinesis, mod LVH, diastolic dysfunction, elevated filling pressure EEG 3/24 >> burst suppression pattern with electrographic seizures within bursts, pattern usually associated with poor outcome  CXR 3/27 >> worse CHF, more B effusion  CULTURES: BCx2 3/23 >> (-) Sputum 3/24 >> abundant H influenza UC 3/23 >> (-)  ANTIBIOTICS: Rocephin 3/27 >>  SIGNIFICANT EVENTS: 3/22 Admitted after out of hospital VF arrest.  3/25 Reached re-warming ~ 0200 3/26 Ongoing burst activity on EEG despite heavy (versed _0 , prop 70 mcg, fent 400 + AED's) sedation  LINES/TUBES: ETT 03/22 > R IJ CVL 3/22 > R rad A line 3/22 >   DISCUSSION: 65 y.o. M admitted 03/22  after out of hospital VF arrest. He was taken to cath lab for emergent cardiac cath and this revealed severe 3 vessel disease not amenable to PCI. He had EF of 25% so IABP was placed. Following procedure, he returned to ICU where hypothermia protocol was initiated. Re-warmed ~0200  3/25. Szes over the weekend.   ASSESSMENT / PLAN:  CARDIOVASCULAR A:  Out of hospital VF arrest - s/p cardiac cath with reported severe 3 vessel disease not amenable to PCI. EF of 25% so IABP was placed sCHF - reported EF of ~25%, global hypokinesis, also with Diastolic Dysfxn  Severe 3 vessel CAD  Hx HTN, HLD P:  S/P cooling protocol >> rewarmed.  Cant diuresce as pt is on 3 pressors. Clinically getting worse. Low bp despite pressors. Oliguric.  Cardiology following, appreciate input Pt on IABP 1: 1.   NEUROLOGIC A:  Acute metabolic encephalopathy - secondary to cardiac arrest. Severe Anoxic Injury Status Epilepticus -- controlled. Hx anxiety P:  Pt with EEG c/w severe anoxic injury. Neurology following, appreciate input Observe for sze recurrence  PULMONARY A: Acute hypoxemic hypercarbic respiratory failure - hypercarbia resolved, due to respiratory insufficiency in the setting of cardiac arrest, pulm edema, possible CAP with H influenza in sputum.  Mucus Plugging - resolved.  P:  PRVC, 8 cc/kg VAP prevention measures On rocephin for H influenza. No weaning.   RENAL A:  AKI -- worsening. Oliguric.  Volume overload Hypokalemia - anticipate worsening during hypothermia protocol Hypocalcemia Hyperchloremia - suspect 2/2 to NS administration AGMA - lactate  P:  Replace electrolytes as indicated Reduce NS to KVO Trend BMP / UOP  Creat climbing, making urine. Oliguric.   GASTROINTESTINAL A:  GI prophylaxis Nutrition P:  SUP: Pantoprazole Cont TF  HEMATOLOGIC A:  Anemia - mild, no evidence of bleeding  VTE Prophylaxis P:  SCD's / heparin gtt per pharmacy  Trend CBC  INFECTIOUS A:  CAP with abundant H. Influenza in sputum  P:  Cont Rocephin Monitor clinically Cultures as above   ENDOCRINE A:  DM 2 TSH wnl  P:  ICU hyperglycemia protocol Lantus 10 units Q24 Observe BS -- may need tighter sugar control.    Code status : DNR. No escalation of care. Family not ready to withdraw care.  3/30 Pt last 24 hrs has clinically worsened -- lower BP, oliguric. Will talk to family re: withdraWAL of care.    I spent 35 minutes of Critical care time with this patient today. No family around.   Monica Becton, MD 10/25/15, 8:32 AM [facility] Pager [phone] After 3 pm or if no answer, call [phone]     Pt noted to be in asystole with IABP and ventilator. Family made aware. Pt time of death confirmed at 06-Sep-2119 with two RNs Baxter Flattery, RN and Rachel Moulds, Therapist, sports). Listened to heart and lung sounds for one full minute. No breath sounds or lung sounds noted. Elink and RRT notified. Family given time with patient during this time. Chaplin at bedside.   Monica Becton, MD 10/19/2015, 12:40 PM [facility] Pager [phone] After 3 pm or if no answer, call [phone]

## 2015-10-20 DEATH — deceased

## 2015-10-23 ENCOUNTER — Telehealth: Payer: Self-pay

## 2015-10-23 NOTE — Telephone Encounter (Signed)
On 10/23/2015 I received a death certificate from Cherre Hugeramara Clarke with [facility] ([facility] The death certificate is for burial. The patient is a patient of Doctor Dios. [facility] am for [facility]. On 10/24/2015 I received the death certificate from Doctor Dios. I got the death certificate ready and called Delaney Meigsamara with [facility] to let her know the death certificate is ready for pickup.

## 2017-05-04 IMAGING — CR DG ABD PORTABLE 1V
1 series · 1 of 1 positions shown · non-contrast
Comparison: None.

CLINICAL DATA: 64-year-old male status post central line and
enteric tube placement.

EXAM:
PORTABLE ABDOMEN - 1 VIEW

[AP]
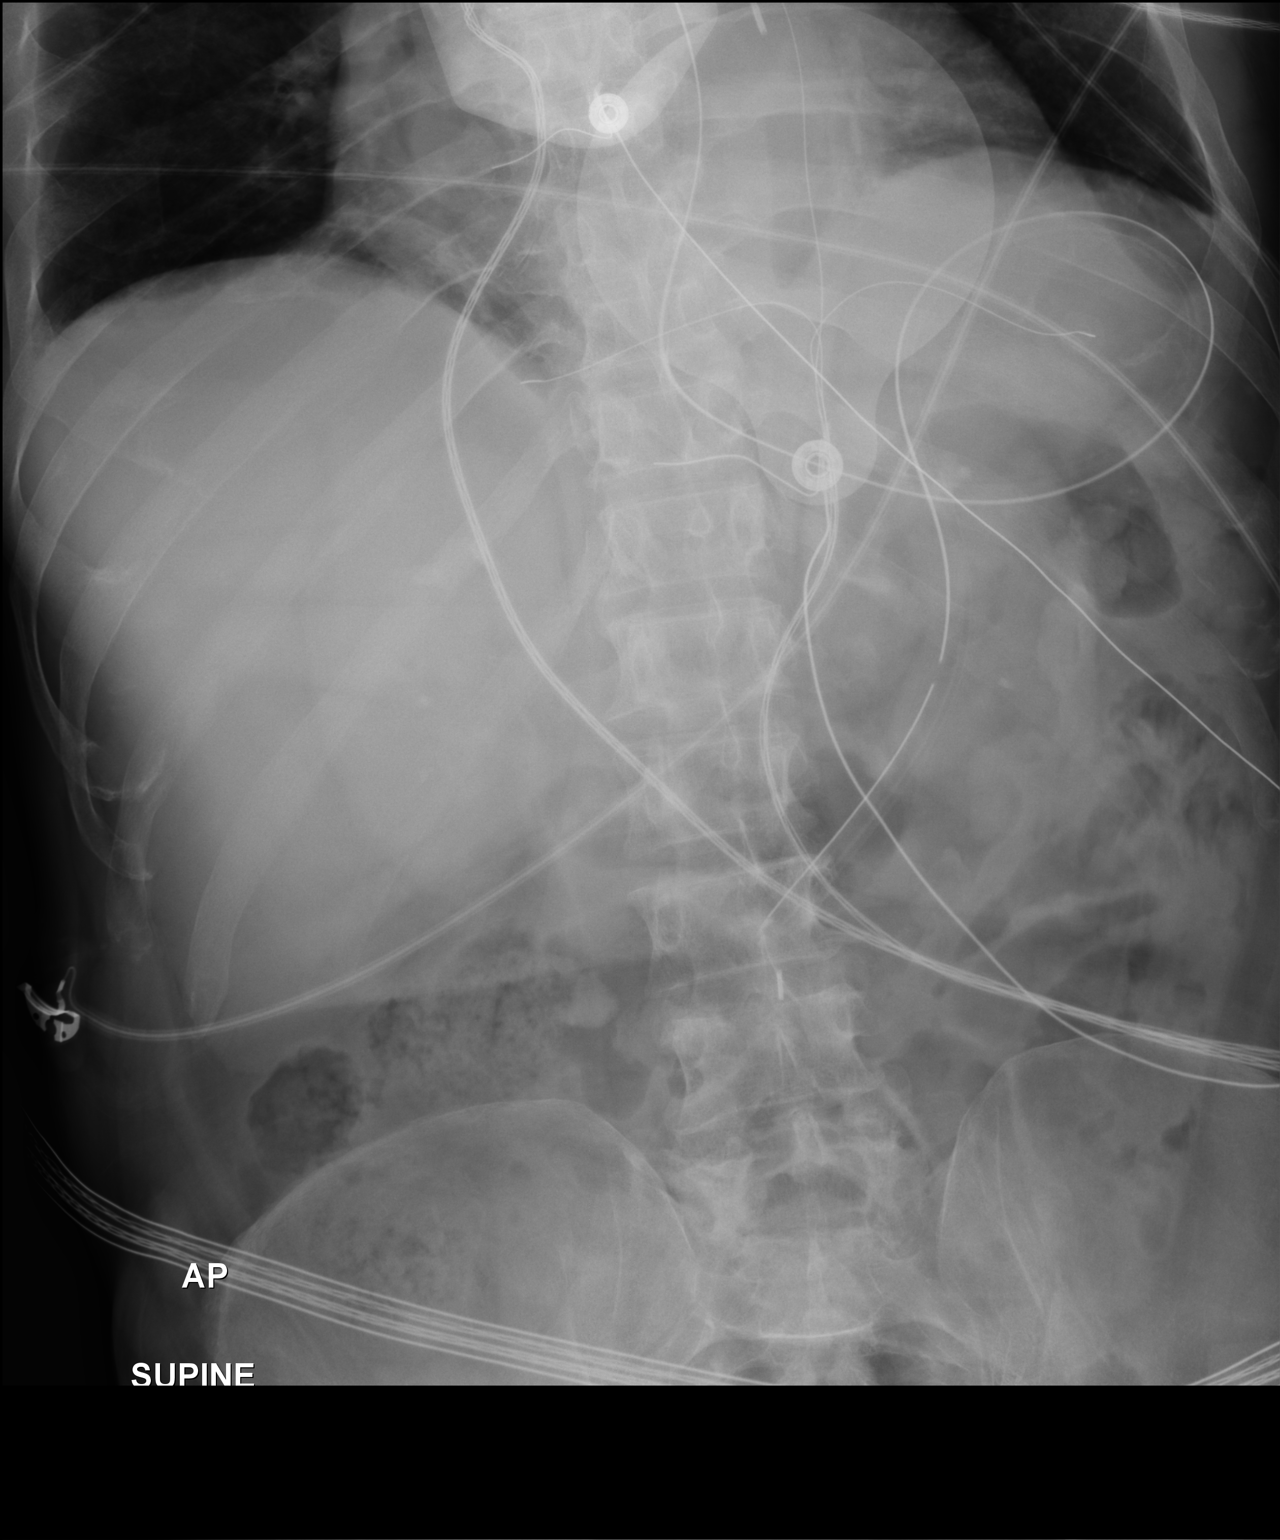

[1 of 1 positions shown; findings below may reference images not displayed]

FINDINGS: Enteric tube is noted which loops in the left upper abdomen with tip
extending down and positioned over the L3 vertebra. No evidence of
bowel obstruction. No free air. Multiple radiopaque foci over the
renal silhouette bilaterally likely represent kidney stones. No
acute osseous pathology identified.
IMPRESSION: Enteric tube loops around in the left upper abdomen with tip over
the L3 vertebra. No bowel dilatation.

Probable small bilateral renal calculi.

## 2017-05-06 IMAGING — CR DG CHEST 1V PORT
1 series · 1 of 1 positions shown · non-contrast
Comparison: 10/10/2015

CLINICAL DATA: Check endotracheal tube placement

EXAM:
PORTABLE CHEST 1 VIEW

[AP]
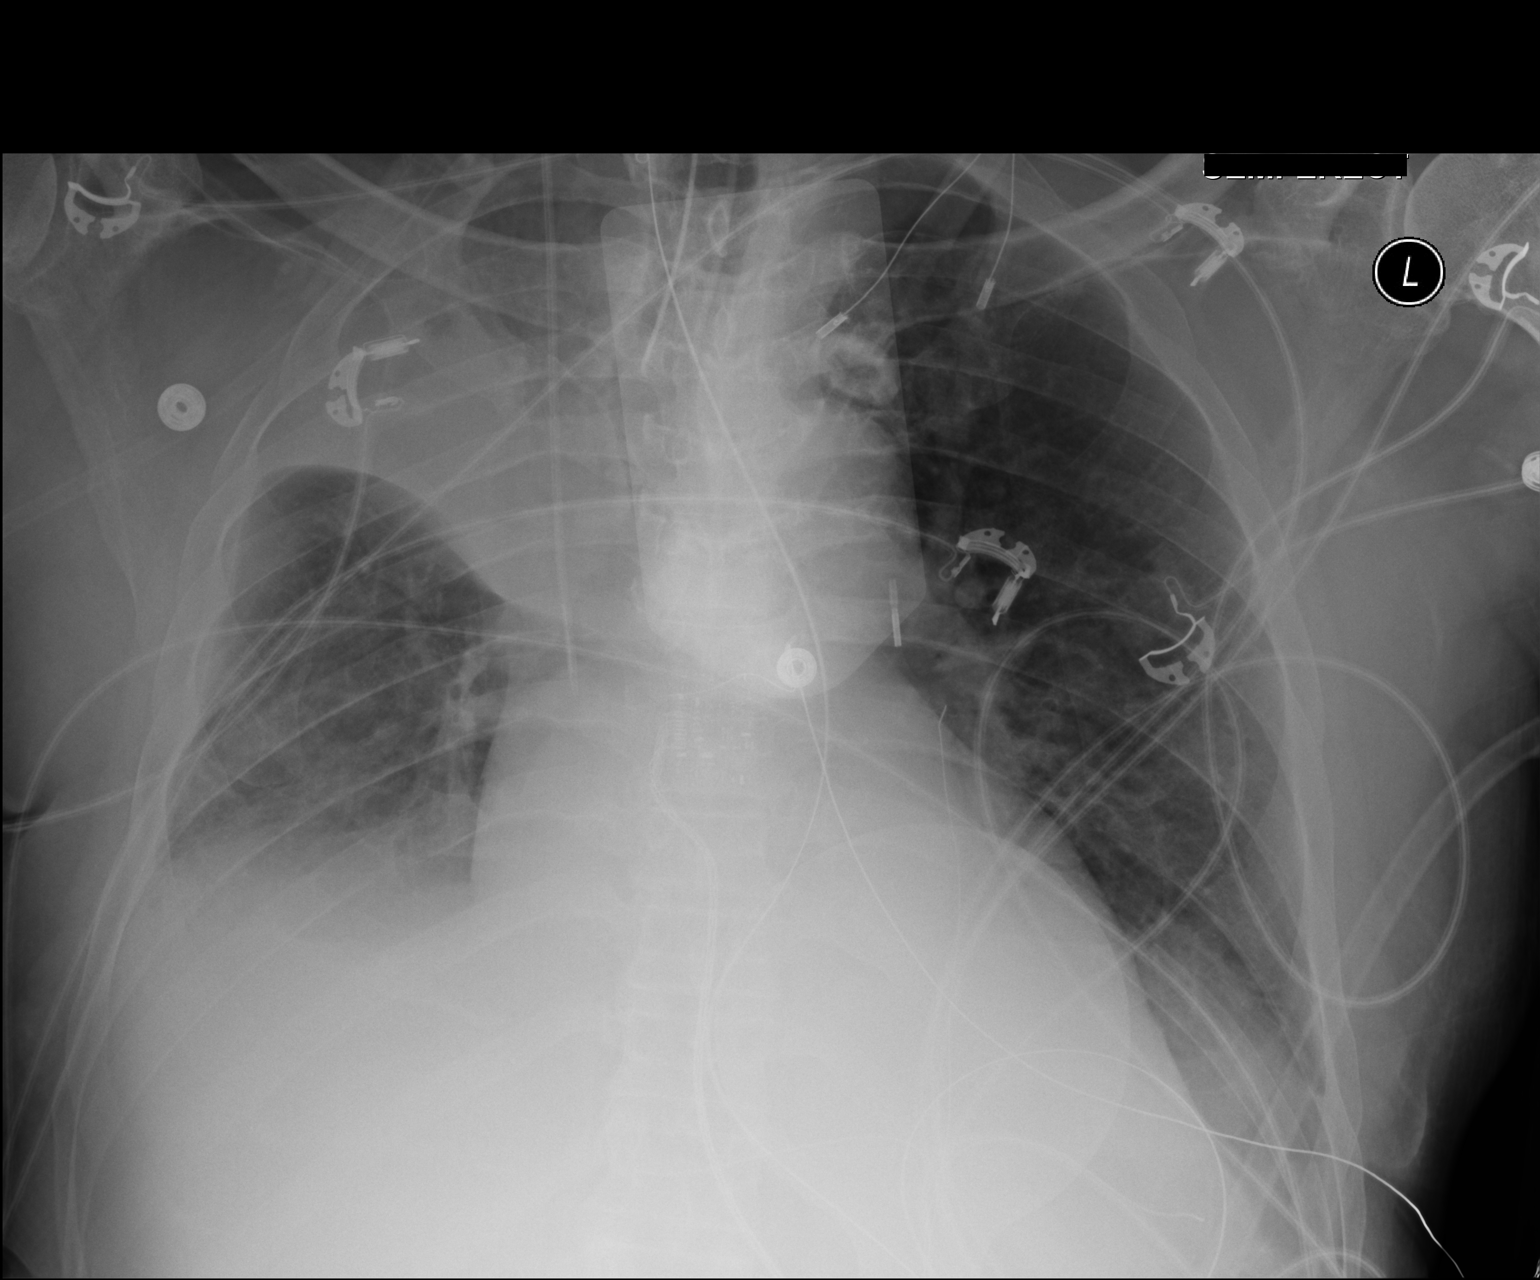

[1 of 1 positions shown; findings below may reference images not displayed]

FINDINGS: Cardiac shadow is stable. An endotracheal tube is again seen
approximately 5 cm above the carina. A right jugular central line
and nasogastric catheter are again noted and stable. New increased
density is noted in the right upper lobe consistent with
consolidation likely related mucous plugging given the acuteness of
the abnormality. Right-sided pleural effusion is noted. The left
lung remains clear.
IMPRESSION: New right upper lobe consolidation likely related to mucous
plugging.

## 2017-05-07 IMAGING — CR DG CHEST 1V PORT
1 series · 1 of 1 positions shown · non-contrast
Comparison: 10/12/2015.

CLINICAL DATA: Respiratory distress.

EXAM:
PORTABLE CHEST 1 VIEW

[AP]
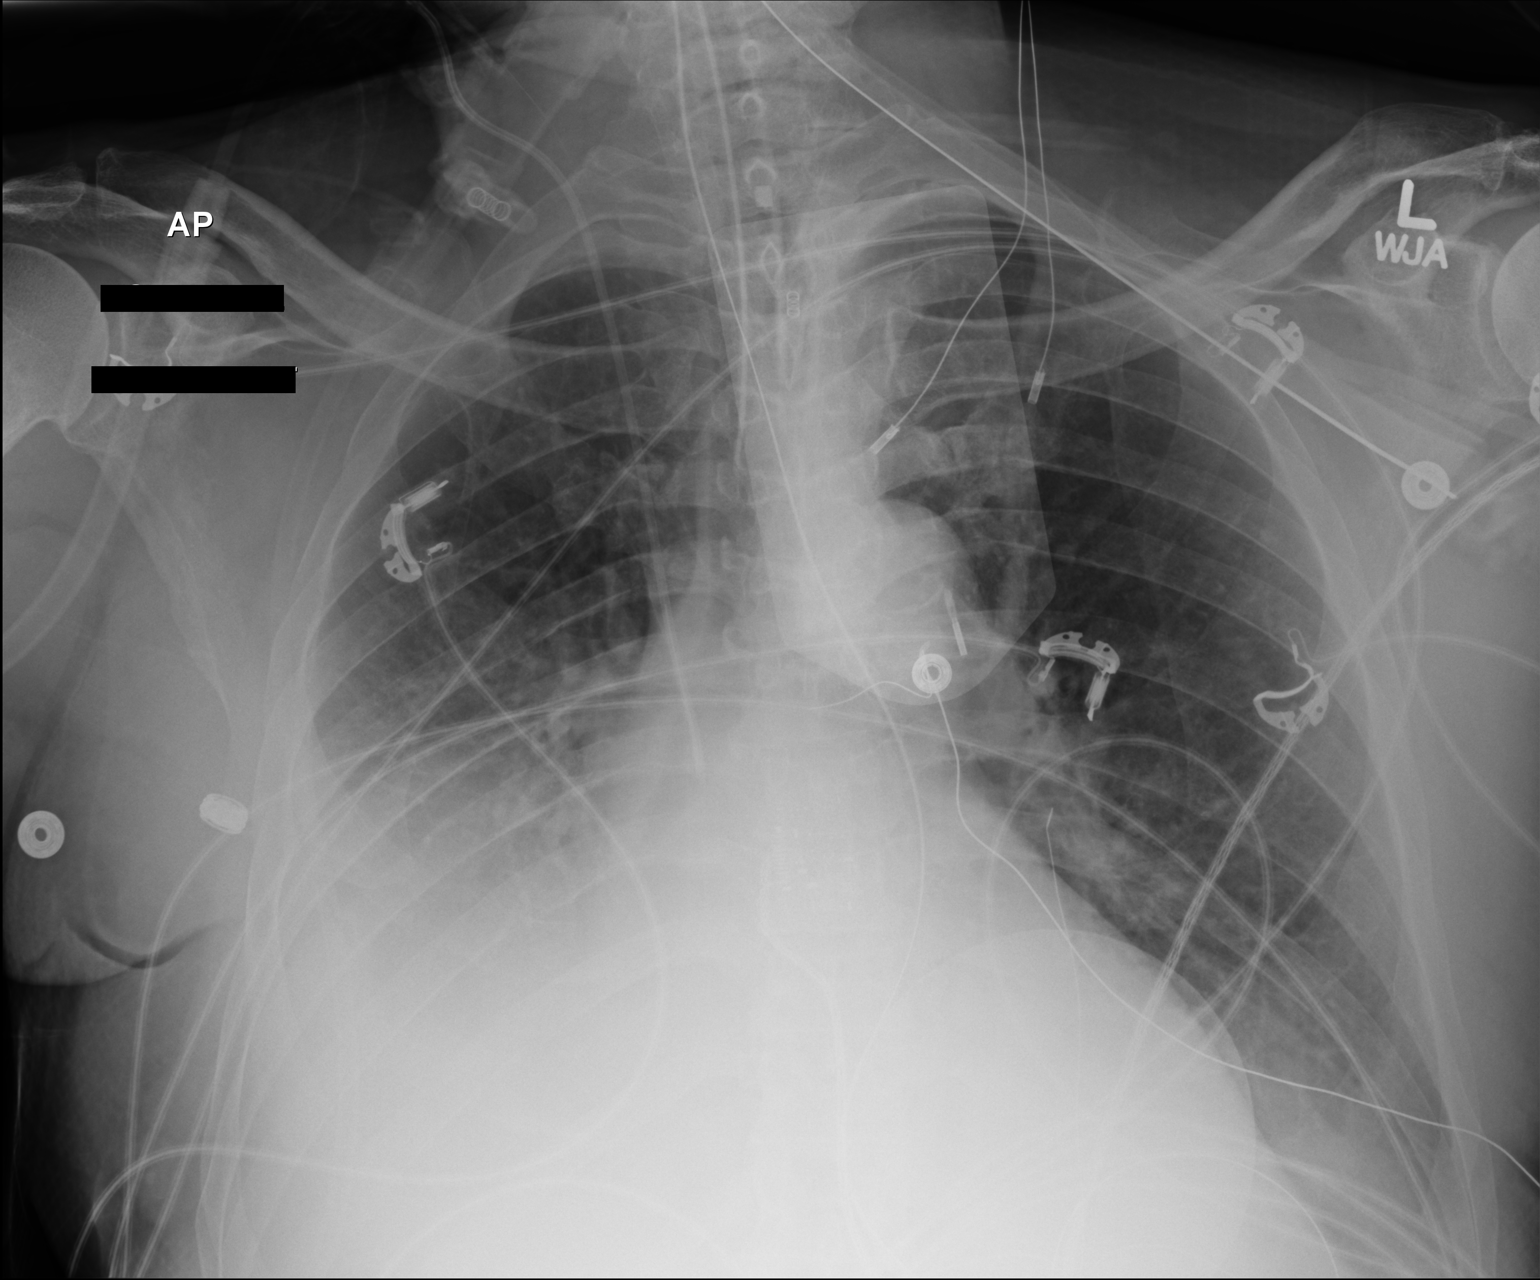

[1 of 1 positions shown; findings below may reference images not displayed]

FINDINGS: Support apparatus stable. ETT in good position 6 cm above carina.
RIGHT upper lobe atelectasis is improved. BILATERAL pleural
effusions, RIGHT greater than LEFT for cyst. Mild vascular
congestion.
IMPRESSION: Improved aeration, with apparent clearing of suspected mucous
plugging obstructing the RIGHT upper lobe bronchus.
